# Patient Record
Sex: Female | Born: 1970
Health system: Southern US, Community
[De-identification: ages and names within clinical notes are randomized; demographics above are authoritative.]

## PROBLEM LIST (undated history)

## (undated) DIAGNOSIS — M069 Rheumatoid arthritis, unspecified: Secondary | ICD-10-CM

## (undated) DIAGNOSIS — F419 Anxiety disorder, unspecified: Secondary | ICD-10-CM

## (undated) HISTORY — DX: Anxiety disorder, unspecified: F41.9

## (undated) HISTORY — DX: Rheumatoid arthritis, unspecified: M06.9

## (undated) HISTORY — PX: BACK SURGERY: SHX140

## (undated) HISTORY — PX: AUGMENTATION MAMMAPLASTY: SUR837

## (undated) HISTORY — PX: ABDOMINOPLASTY: SUR9

---

## 2012-10-28 DIAGNOSIS — M545 Low back pain, unspecified: Secondary | ICD-10-CM | POA: Insufficient documentation

## 2016-05-07 DIAGNOSIS — M0579 Rheumatoid arthritis with rheumatoid factor of multiple sites without organ or systems involvement: Secondary | ICD-10-CM | POA: Insufficient documentation

## 2017-08-04 DIAGNOSIS — Z79899 Other long term (current) drug therapy: Secondary | ICD-10-CM | POA: Insufficient documentation

## 2018-10-05 DIAGNOSIS — M0579 Rheumatoid arthritis with rheumatoid factor of multiple sites without organ or systems involvement: Secondary | ICD-10-CM | POA: Diagnosis not present

## 2018-10-05 DIAGNOSIS — Z79899 Other long term (current) drug therapy: Secondary | ICD-10-CM | POA: Diagnosis not present

## 2019-05-04 DIAGNOSIS — Z79899 Other long term (current) drug therapy: Secondary | ICD-10-CM | POA: Diagnosis not present

## 2019-05-04 DIAGNOSIS — M0579 Rheumatoid arthritis with rheumatoid factor of multiple sites without organ or systems involvement: Secondary | ICD-10-CM | POA: Diagnosis not present

## 2019-05-04 DIAGNOSIS — R7989 Other specified abnormal findings of blood chemistry: Secondary | ICD-10-CM | POA: Diagnosis not present

## 2019-08-01 DIAGNOSIS — H5213 Myopia, bilateral: Secondary | ICD-10-CM | POA: Diagnosis not present

## 2019-08-01 DIAGNOSIS — Z79899 Other long term (current) drug therapy: Secondary | ICD-10-CM | POA: Diagnosis not present

## 2019-12-21 DIAGNOSIS — M0579 Rheumatoid arthritis with rheumatoid factor of multiple sites without organ or systems involvement: Secondary | ICD-10-CM | POA: Diagnosis not present

## 2019-12-21 DIAGNOSIS — R7989 Other specified abnormal findings of blood chemistry: Secondary | ICD-10-CM | POA: Diagnosis not present

## 2019-12-21 DIAGNOSIS — Z79899 Other long term (current) drug therapy: Secondary | ICD-10-CM | POA: Diagnosis not present

## 2020-02-24 DIAGNOSIS — T2661XA Corrosion of cornea and conjunctival sac, right eye, initial encounter: Secondary | ICD-10-CM | POA: Diagnosis not present

## 2020-05-10 ENCOUNTER — Ambulatory Visit: Payer: Self-pay | Admitting: Medical-Surgical

## 2020-05-17 ENCOUNTER — Ambulatory Visit: Payer: Self-pay | Admitting: Medical-Surgical

## 2020-06-14 ENCOUNTER — Encounter: Payer: Self-pay | Admitting: Medical-Surgical

## 2020-06-14 ENCOUNTER — Ambulatory Visit (INDEPENDENT_AMBULATORY_CARE_PROVIDER_SITE_OTHER): Payer: BC Managed Care – PPO | Admitting: Medical-Surgical

## 2020-06-14 VITALS — BP 145/88 | HR 77 | Temp 97.9°F | Ht 64.0 in | Wt 142.4 lb

## 2020-06-14 DIAGNOSIS — Z23 Encounter for immunization: Secondary | ICD-10-CM | POA: Diagnosis not present

## 2020-06-14 DIAGNOSIS — F419 Anxiety disorder, unspecified: Secondary | ICD-10-CM | POA: Diagnosis not present

## 2020-06-14 DIAGNOSIS — M0579 Rheumatoid arthritis with rheumatoid factor of multiple sites without organ or systems involvement: Secondary | ICD-10-CM

## 2020-06-14 DIAGNOSIS — Z7689 Persons encountering health services in other specified circumstances: Secondary | ICD-10-CM

## 2020-06-14 DIAGNOSIS — Z1211 Encounter for screening for malignant neoplasm of colon: Secondary | ICD-10-CM

## 2020-06-14 DIAGNOSIS — Z1231 Encounter for screening mammogram for malignant neoplasm of breast: Secondary | ICD-10-CM | POA: Diagnosis not present

## 2020-06-14 MED ORDER — ALPRAZOLAM 0.25 MG PO TABS: 0.1250 mg | ORAL_TABLET | Freq: Every day | ORAL | 0 refills | Status: DC | PRN

## 2020-06-14 MED ORDER — HYDROXYCHLOROQUINE SULFATE 200 MG PO TABS: 400.0000 mg | ORAL_TABLET | Freq: Every day | ORAL | 0 refills | Status: DC

## 2020-06-14 MED ORDER — SERTRALINE HCL 50 MG PO TABS: 50.0000 mg | ORAL_TABLET | Freq: Every day | ORAL | 1 refills | Status: DC

## 2020-06-14 NOTE — Progress Notes (Signed)
New Patient Office Visit  Subjective:  Patient ID: Suzanne Erickson, female    DOB: 04-Mar-1971  Age: 49 y.o. MRN: [id number]  CC:  Chief Complaint  Patient presents with  . Establish Care    HPI Suzanne Erickson presents to establish care.  Works in Agricultural engineer.  Has a boxer who tends to be an anxious dog and has to take Prozac.  RA-was previously diagnosed with RA that affects her bilateral knees and ankles.  She notes that she does not often have pain in her knees and ankles but she does experience significant stiffness that at times limits her overall mobility.  She takes Plaquenil 400 mg daily and uses ibuprofen 400 mg as needed for discomfort.  She would like a referral to rheumatology to get established with a new provider today as her previous provider has left the practice.  Anxiety-taking sertraline 50 mg daily and Xanax 0.25 mg 1/2-1 tablet daily as needed.  Reports she uses these very sparingly and often 15 tablets will last her nearly a full year.  Notes her daughter recently left for basic training and she has been quite anxious for her welfare since then.  Oral lesions-recently saw a dentist who reported she had some ulcers/lesions on the roof of her mouth.  She has not noticed any pain or discomfort from these lesions and is unable to feel them.  Overdue for colonoscopy and mammogram.  Past Medical History:  Diagnosis Date  . Anxiety   . Rheumatoid arthritis [facility])     Past Surgical History:  Procedure Laterality Date  . BACK SURGERY      Family History  Problem Relation Age of Onset  . Hypertension Mother   . Osteoarthritis Mother   . Hypertension Father   . Skin cancer Father   . Atrial fibrillation Father     Social History   Socioeconomic History  . Marital status: Married    Spouse name: Not on file  . Number of children: Not on file  . Years of education: Not on file  . Highest education level: Not on file  Occupational History  . Not on file   Tobacco Use  . Smoking status: Never Smoker  . Smokeless tobacco: Never Used  Vaping Use  . Vaping Use: Never used  Substance and Sexual Activity  . Alcohol use: Yes    Alcohol/week: 14.0 standard drinks    Types: 14 Standard drinks or equivalent per week    Comment: 2 cocktails/evening  . Drug use: Never  . Sexual activity: Yes    Partners: Male    Birth control/protection: Surgical    Comment: Ablation, vasectomy  Other Topics Concern  . Not on file  Social History Narrative  . Not on file   Social Determinants of Health   Financial Resource Strain:   . Difficulty of Paying Living Expenses: Not on file  Food Insecurity:   . Worried About Programme researcher, broadcasting/film/video in the Last Year: Not on file  . Ran Out of Food in the Last Year: Not on file  Transportation Needs:   . Lack of Transportation (Medical): Not on file  . Lack of Transportation (Non-Medical): Not on file  Physical Activity:   . Days of Exercise per Week: Not on file  . Minutes of Exercise per Session: Not on file  Stress:   . Feeling of Stress : Not on file  Social Connections:   . Frequency of Communication with Friends and Family: Not on file  .  Frequency of Social Gatherings with Friends and Family: Not on file  . Attends Religious Services: Not on file  . Active Member of Clubs or Organizations: Not on file  . Attends Banker Meetings: Not on file  . Marital Status: Not on file  Intimate Partner Violence:   . Fear of Current or Ex-Partner: Not on file  . Emotionally Abused: Not on file  . Physically Abused: Not on file  . Sexually Abused: Not on file    ROS Review of Systems  Constitutional: Negative for chills, fatigue, fever and unexpected weight change.  Respiratory: Negative for cough, chest tightness, shortness of breath and wheezing.   Cardiovascular: Negative for chest pain, palpitations and leg swelling.  Gastrointestinal: Negative for abdominal pain, diarrhea, nausea and  vomiting.  Musculoskeletal: Positive for arthralgias.  Neurological: Negative for dizziness, light-headedness and headaches.  Psychiatric/Behavioral: Negative for dysphoric mood, self-injury, sleep disturbance and suicidal ideas. The patient is nervous/anxious.     Objective:   Today's Vitals: BP (!) 145/88   Pulse 77   Temp 97.9 F (36.6 C) (Oral)   Ht 5\' 4"  (1.626 m)   Wt 142 lb 6.4 oz (64.6 kg)   SpO2 100%   BMI 24.44 kg/m   Physical Exam Vitals reviewed.  Constitutional:      General: She is not in acute distress.    Appearance: Normal appearance.  HENT:     Head: Normocephalic and atraumatic.     Mouth/Throat:     Mouth: Mucous membranes are moist.     Pharynx: Oropharynx is clear. No posterior oropharyngeal erythema.     Comments: No oral lesions visualized along the hard and soft palate. Cardiovascular:     Rate and Rhythm: Normal rate and regular rhythm.     Pulses: Normal pulses.     Heart sounds: Normal heart sounds. No murmur heard.  No friction rub. No gallop.   Pulmonary:     Effort: Pulmonary effort is normal. No respiratory distress.     Breath sounds: Normal breath sounds. No wheezing.  Skin:    General: Skin is warm and dry.  Neurological:     Mental Status: She is alert and oriented to person, place, and time.  Psychiatric:        Mood and Affect: Mood normal.        Behavior: Behavior normal.        Thought Content: Thought content normal.        Judgment: Judgment normal.     Assessment & Plan:   1. Encounter to establish care Reviewed available information and discussed health care concerns with patient.  She is due for some lab work to follow-up on her RA.  2. Rheumatoid arthritis involving multiple sites with positive rheumatoid factor (HCC) Referring to rheumatology per patient request.  I will go ahead and refill her Plaquenil until she is able to establish a new provider there. Checking CBC with diff, CMP, and CRP. - Ambulatory  referral to Rheumatology - COMPLETE METABOLIC PANEL WITH GFR - High sensitivity CRP - CBC with Differential  3. Anxiety Continue Sertraline 50mg  daily. Refills provided. Ok to continue using Xanax very sparingly as needed.   4. Encounter for screening mammogram for malignant neoplasm of breast Mammogram ordered. - MM DIGITAL SCREENING BILATERAL; Future  5. Screening for colon cancer Referring to GI for colonoscopy. - Ambulatory referral to Gastroenterology  6. Need for influenza vaccination Flu shot given in office today. - Flu Vaccine QUAD  36+ mos IM  7. Need for Tdap vaccination Tdap updated in office today. - Tdap vaccine greater than or equal to 7yo IM  Outpatient Encounter Medications as of 06/14/2020  Medication Sig  . ALPRAZolam (XANAX) 0.25 MG tablet Take 0.5-1 tablets (0.125-0.25 mg total) by mouth daily as needed.  . hydroxychloroquine (PLAQUENIL) 200 MG tablet Take 2 tablets (400 mg total) by mouth daily.  . Multiple Vitamin tablet Take 1 tablet by mouth daily.  . sertraline (ZOLOFT) 50 MG tablet Take 1 tablet (50 mg total) by mouth daily.  . [DISCONTINUED] ALPRAZolam (XANAX) 0.25 MG tablet Take 0.5-1 tablets by mouth 3 (three) times daily as needed.  . [DISCONTINUED] hydroxychloroquine (PLAQUENIL) 200 MG tablet Take 2 tablets by mouth daily.  . [DISCONTINUED] sertraline (ZOLOFT) 50 MG tablet Take 1 tablet by mouth daily.   No facility-administered encounter medications on file as of 06/14/2020.    Follow-up: Return in about 6 months (around 12/12/2020) for mood follow up.   Thayer Ohm, DNP, APRN, FNP-BC [facility]

## 2020-06-28 ENCOUNTER — Telehealth: Payer: Self-pay

## 2020-06-28 NOTE — Telephone Encounter (Signed)
-----   Message from Christen Butter, NP sent at 06/28/2020 12:33 PM EDT ----- Please contact patient to see how her blood pressures have been running at home.  She was elevated here at her office visit 2 weeks ago and was advised to check her blood pressures and see what they are running at home.

## 2020-06-28 NOTE — Telephone Encounter (Signed)
LVMTRC (1st attempt)    Please see Joy's message below  

## 2020-07-02 NOTE — Telephone Encounter (Signed)
Spoke to pt who states she really has not been checking her BP at home since her OV. She said that she would start checking her BP and get back in touch with [place] later on this week with her readings.

## 2020-07-16 ENCOUNTER — Telehealth: Payer: Self-pay | Admitting: Medical-Surgical

## 2020-07-16 ENCOUNTER — Telehealth: Payer: Self-pay

## 2020-07-16 NOTE — Telephone Encounter (Signed)
-----   Message from Christen Butter, NP sent at 07/16/2020  8:16 AM EDT ----- Regarding: Blood pressure checkins Can you please contact Steph and see how her blood pressures have been? It was running high at her appointment and she was going to keep an eye on it.  Thanks, Joy ----- Message ----- From: Christen Butter, NP Sent: 07/16/2020 To: Christen Butter, NP  Check in with BPs

## 2020-07-16 NOTE — Telephone Encounter (Signed)
LVMTRC (1st attempt)  Please see Joy's note below 

## 2020-07-16 NOTE — Telephone Encounter (Signed)
Erroneous encounter

## 2020-07-17 NOTE — Telephone Encounter (Signed)
LVMTRC (2nd attempt) Please see Joy's note below

## 2020-07-18 NOTE — Telephone Encounter (Signed)
Patient aware of recommendations.  

## 2020-07-18 NOTE — Telephone Encounter (Signed)
Although the highest is a little above goal, with these readings, there is no indication to start medications. Continue to monitor at home several times a week with a goal of 130/80 or less. If consistently above this goal, will need to return for further evaluation. Thanks, Ander Slade

## 2020-07-18 NOTE — Telephone Encounter (Signed)
Patient called to report her recent BP readings.  The highest one is 134/86 and the lowest one is 115/82.  She has had multiple readings that fall between these two.  She can be reached at [phone] with any further recommendations.

## 2020-10-19 ENCOUNTER — Other Ambulatory Visit: Payer: Self-pay

## 2020-10-19 MED ORDER — HYDROXYCHLOROQUINE SULFATE 200 MG PO TABS: 400.0000 mg | ORAL_TABLET | Freq: Every day | ORAL | 0 refills | Status: DC

## 2020-12-14 ENCOUNTER — Ambulatory Visit: Payer: BC Managed Care – PPO | Admitting: Medical-Surgical

## 2020-12-20 ENCOUNTER — Ambulatory Visit (INDEPENDENT_AMBULATORY_CARE_PROVIDER_SITE_OTHER): Payer: BC Managed Care – PPO

## 2020-12-20 ENCOUNTER — Other Ambulatory Visit: Payer: Self-pay

## 2020-12-20 DIAGNOSIS — Z1231 Encounter for screening mammogram for malignant neoplasm of breast: Secondary | ICD-10-CM | POA: Diagnosis not present

## 2020-12-21 ENCOUNTER — Ambulatory Visit (INDEPENDENT_AMBULATORY_CARE_PROVIDER_SITE_OTHER): Payer: BC Managed Care – PPO | Admitting: Medical-Surgical

## 2020-12-21 ENCOUNTER — Encounter: Payer: Self-pay | Admitting: Medical-Surgical

## 2020-12-21 VITALS — BP 117/73 | HR 67 | Temp 98.5°F | Ht 64.0 in | Wt 149.2 lb

## 2020-12-21 DIAGNOSIS — Z1211 Encounter for screening for malignant neoplasm of colon: Secondary | ICD-10-CM | POA: Diagnosis not present

## 2020-12-21 DIAGNOSIS — M0579 Rheumatoid arthritis with rheumatoid factor of multiple sites without organ or systems involvement: Secondary | ICD-10-CM | POA: Diagnosis not present

## 2020-12-21 DIAGNOSIS — Z23 Encounter for immunization: Secondary | ICD-10-CM | POA: Diagnosis not present

## 2020-12-21 DIAGNOSIS — F419 Anxiety disorder, unspecified: Secondary | ICD-10-CM | POA: Diagnosis not present

## 2020-12-21 MED ORDER — ALPRAZOLAM 0.25 MG PO TABS: 0.1250 mg | ORAL_TABLET | Freq: Every day | ORAL | 0 refills | Status: AC | PRN

## 2020-12-21 MED ORDER — SERTRALINE HCL 50 MG PO TABS: 50.0000 mg | ORAL_TABLET | Freq: Every day | ORAL | 1 refills | Status: DC

## 2020-12-21 MED ORDER — HYDROXYCHLOROQUINE SULFATE 200 MG PO TABS: 400.0000 mg | ORAL_TABLET | Freq: Every day | ORAL | 1 refills | Status: DC

## 2020-12-21 NOTE — Patient Instructions (Signed)
Recombinant Zoster (Shingles) Vaccine: What You Need to Know 1. Why get vaccinated? Recombinant zoster (shingles) vaccine can prevent shingles. Shingles (also called herpes zoster, or just zoster) is a painful skin rash, usually with blisters. In addition to the rash, shingles can cause fever, headache, chills, or upset stomach. More rarely, shingles can lead to pneumonia, hearing problems, blindness, brain inflammation (encephalitis), or death. The most common complication of shingles is long-term nerve pain called postherpetic neuralgia (PHN). PHN occurs in the areas where the shingles rash was, even after the rash clears up. It can last for months or years after the rash goes away. The pain from PHN can be severe and debilitating. About 10 to 18% of people who get shingles will experience PHN. The risk of PHN increases with age. An older adult with shingles is more likely to develop PHN and have longer lasting and more severe pain than a younger person with shingles. Shingles is caused by the varicella zoster virus, the same virus that causes chickenpox. After you have chickenpox, the virus stays in your body and can cause shingles later in life. Shingles cannot be passed from one person to another, but the virus that causes shingles can spread and cause chickenpox in someone who had never had chickenpox or received chickenpox vaccine. 2. Recombinant shingles vaccine Recombinant shingles vaccine provides strong protection against shingles. By preventing shingles, recombinant shingles vaccine also protects against PHN. Recombinant shingles vaccine is the preferred vaccine for the prevention of shingles. However, a different vaccine, live shingles vaccine, may be used in some circumstances. The recombinant shingles vaccine is recommended for adults 50 years and older without serious immune problems. It is given as a two-dose series. This vaccine is also recommended for people who have already gotten  another type of shingles vaccine, the live shingles vaccine. There is no live virus in this vaccine. Shingles vaccine may be given at the same time as other vaccines. 3. Talk with your health care provider Tell your vaccine provider if the person getting the vaccine:  Has had an allergic reaction after a previous dose of recombinant shingles vaccine, or has any severe, life-threatening allergies.  Is pregnant or breastfeeding.  Is currently experiencing an episode of shingles. In some cases, your health care provider may decide to postpone shingles vaccination to a future visit. People with minor illnesses, such as a cold, may be vaccinated. People who are moderately or severely ill should usually wait until they recover before getting recombinant shingles vaccine. Your health care provider can give you more information. 4. Risks of a vaccine reaction  A sore arm with mild or moderate pain is very common after recombinant shingles vaccine, affecting about 80% of vaccinated people. Redness and swelling can also happen at the site of the injection.  Tiredness, muscle pain, headache, shivering, fever, stomach pain, and nausea happen after vaccination in more than half of people who receive recombinant shingles vaccine. In clinical trials, about 1 out of 6 people who got recombinant zoster vaccine experienced side effects that prevented them from doing regular activities. Symptoms usually went away on their own in 2 to 3 days. You should still get the second dose of recombinant zoster vaccine even if you had one of these reactions after the first dose. People sometimes faint after medical procedures, including vaccination. Tell your provider if you feel dizzy or have vision changes or ringing in the ears. As with any medicine, there is a very remote chance of a vaccine causing   a severe allergic reaction, other serious injury, or death. 5. What if there is a serious problem? An allergic reaction  could occur after the vaccinated person leaves the clinic. If you see signs of a severe allergic reaction (hives, swelling of the face and throat, difficulty breathing, a fast heartbeat, dizziness, or weakness), call 9-1-1 and get the person to the nearest hospital. For other signs that concern you, call your health care provider. Adverse reactions should be reported to the Vaccine Adverse Event Reporting System (VAERS). Your health care provider will usually file this report, or you can do it yourself. Visit the VAERS website at www.vaers.hhs.gov or call 1-800-822-7967. VAERS is only for reporting reactions, and VAERS staff do not give medical advice. 6. How can I learn more?  Ask your health care provider.  Call your local or state health department.  Contact the Centers for Disease Control and Prevention (CDC): ? Call 1-800-232-4636 (1-800-CDC-INFO) or ? Visit CDC's website at www.cdc.gov/vaccines Vaccine Information Statement Recombinant Zoster Vaccine (07/28/2018) This information is not intended to replace advice given to you by your health care provider. Make sure you discuss any questions you have with your health care provider. Document Revised: 05/18/2020 Document Reviewed: 05/18/2020 Elsevier Patient Education  2021 Elsevier Inc.  

## 2020-12-21 NOTE — Progress Notes (Signed)
Subjective:    CC: Mood follow-up  HPI: Pleasant 50 year old female presenting today for follow-up of:  Mood-taking sertraline 50 mg daily, tolerating well without side effects.  Feels that her overall symptoms are very well controlled.  She does have occasional breakthroughs of anxiety but these are not severe.  Every now and then, she does have severe anxiety episodes and she has Xanax on hand to take but cannot remember the last time she had to take a dose.  Denies SI/HI.  RA-taking Plaquenil 4 mg daily, tolerating well without side effects.  Requesting refills today.  She does have a new patient appointment with rheumatology in April already scheduled.  She is overdue for getting a colonoscopy done and would like a referral to get that taken care of today.  She is also due for shingles vaccination and would like to get that started today.  I reviewed the past medical history, family history, social history, surgical history, and allergies today and no changes were needed.  Please see the problem list section below in epic for further details.  Past Medical History: Past Medical History:  Diagnosis Date  . Anxiety   . Rheumatoid arthritis [facility])    Past Surgical History: Past Surgical History:  Procedure Laterality Date  . AUGMENTATION MAMMAPLASTY     Saline, behind the muscle, augmentation was over 15 years ago.  Marland Kitchen BACK SURGERY     Social History: Social History   Socioeconomic History  . Marital status: Married    Spouse name: Not on file  . Number of children: Not on file  . Years of education: Not on file  . Highest education level: Not on file  Occupational History  . Not on file  Tobacco Use  . Smoking status: Never Smoker  . Smokeless tobacco: Never Used  Vaping Use  . Vaping Use: Never used  Substance and Sexual Activity  . Alcohol use: Yes    Alcohol/week: 14.0 standard drinks    Types: 14 Standard drinks or equivalent per week    Comment: 2  cocktails/evening  . Drug use: Never  . Sexual activity: Yes    Partners: Male    Birth control/protection: Surgical    Comment: Ablation, vasectomy  Other Topics Concern  . Not on file  Social History Narrative  . Not on file   Social Determinants of Health   Financial Resource Strain: Not on file  Food Insecurity: Not on file  Transportation Needs: Not on file  Physical Activity: Not on file  Stress: Not on file  Social Connections: Not on file   Family History: Family History  Problem Relation Age of Onset  . Hypertension Mother   . Osteoarthritis Mother   . Hypertension Father   . Skin cancer Father   . Atrial fibrillation Father    Allergies: Allergies  Allergen Reactions  . Amoxicillin Rash  . Sulfa Antibiotics Rash   Medications: See med rec.  Review of Systems: See HPI for pertinent positives and negatives.   Objective:    General: Well Developed, well nourished, and in no acute distress.  Neuro: Alert and oriented x3.  HEENT: Normocephalic, atraumatic.  Skin: Warm and dry. Cardiac: Regular rate and rhythm, no murmurs rubs or gallops, no lower extremity edema.  Respiratory: Clear to auscultation bilaterally. Not using accessory muscles, speaking in full sentences.  Impression and Recommendations:    1. Anxiety Continue sertraline 50 mg daily.  For severe anxiety, okay to use Xanax sparingly.  Sending in  15 tabs today as hers will likely be expiring soon.  2. Rheumatoid arthritis involving multiple sites with positive rheumatoid factor (HCC) Continue hydroxychloroquine 400 mg daily.  Refill provided.  Continue with plan to establish with rheumatology.  3. Need for shingles vaccine Shingles vaccine given in office. - Varicella-zoster vaccine IM  4. Screening for colon cancer Referring to GI for colonoscopy. - Ambulatory referral to Gastroenterology  Return in about 6 months (around 06/23/2021) for general follow up; at your convenience for pap  smear. ___________________________________________ Thayer Ohm, DNP, APRN, FNP-BC Primary Care and Sports Medicine [facility] [place]

## 2021-01-04 NOTE — Progress Notes (Addendum)
Office Visit Note  Patient: Suzanne Erickson             Date of Birth: August 05, 1971           MRN: [id number]             PCP: Christen ButterJessup, Joy, NP Referring: Christen ButterJessup, Joy, NP Visit Date: 01/18/2021 Occupation: @GUAROCC @  Subjective:  Pain and stiffness in joints   History of Present Illness: Suzanne Erickson is a 50 y.o. female with history of seropositive rheumatoid arthritis.  She has been seen in consultation per request of her PCP.  Patient states in 2015 she had a flu vaccine and after that she started having pain in her knee joints and ankle joints.  She states she was seen by her PCP who examined her and diagnosed her with rheumatoid arthritis.  Her rheumatoid factor was also positive.  She was referred to Dr. Ancil LinseyBravo who started her on Plaquenil.  She states she has been on Plaquenil since then.  She had only 1 flare a year ago for which she was given prednisone although she did not notice any improvement in her symptoms on prednisone.  She has not noticed any joint swelling but she continues to have some stiffness in her knees and her feet.  She also notices some discomfort in her upper arms.  She denies discomfort in her shoulders.  There is no history of oral ulcers, nasal ulcers, malar rash, photosensitivity, sicca symptoms, lymphadenopathy.  There is no family history of autoimmune disease.  She is gravida 4, para 2, miscarriages 2.  She is very active and exercise on a regular basis.  Activities of Daily Living:  Patient reports morning stiffness for 10-15 minutes.   Patient Denies nocturnal pain.  Difficulty dressing/grooming: Denies Difficulty climbing stairs: Reports Difficulty getting out of chair: Denies Difficulty using hands for taps, buttons, cutlery, and/or writing: Denies  Review of Systems  Constitutional: Positive for fatigue. Negative for night sweats, weight gain and weight loss.  HENT: Negative for mouth sores, trouble swallowing, trouble swallowing, mouth dryness and  nose dryness.   Eyes: Negative for pain, redness, itching, visual disturbance and dryness.  Respiratory: Negative for cough, shortness of breath and difficulty breathing.   Cardiovascular: Negative for chest pain, palpitations, hypertension, irregular heartbeat and swelling in legs/feet.  Gastrointestinal: Negative for blood in stool, constipation and diarrhea.  Endocrine: Negative for increased urination.  Genitourinary: Negative for difficulty urinating and vaginal dryness.  Musculoskeletal: Positive for morning stiffness. Negative for arthralgias, joint pain, joint swelling, myalgias, muscle weakness, muscle tenderness and myalgias.  Skin: Negative for color change, rash, hair loss, redness, skin tightness, ulcers and sensitivity to sunlight.  Allergic/Immunologic: Negative for susceptible to infections.  Neurological: Negative for dizziness, numbness, headaches, memory loss, night sweats and weakness.  Hematological: Negative for bruising/bleeding tendency and swollen glands.  Psychiatric/Behavioral: Negative for depressed mood, confusion and sleep disturbance. The patient is nervous/anxious.     PMFS History:  Patient Active Problem List   Diagnosis Date Noted   Long-term use of high-risk medication 08/04/2017   Rheumatoid arthritis involving multiple sites with positive rheumatoid factor (HCC) 05/07/2016   Low back pain 10/28/2012   Allergic rhinitis 10/23/2011   Anxiety 04/16/2011    Past Medical History:  Diagnosis Date   Anxiety    Rheumatoid arthritis (HCC)     Family History  Problem Relation Age of Onset   Hypertension Mother    Osteoarthritis Mother    Hypertension Father  Skin cancer Father    Atrial fibrillation Father    Healthy Sister    Healthy Daughter    Healthy Daughter    Past Surgical History:  Procedure Laterality Date   AUGMENTATION MAMMAPLASTY     Saline, behind the muscle, augmentation was over 15 years ago.   BACK SURGERY     Social History    Social History Narrative   Not on file   Immunization History  Administered Date(s) Administered   Influenza, Seasonal, Injecte, Preservative Fre 06/29/2013   Influenza,inj,Quad PF,6+ Mos 06/14/2020   PFIZER(Purple Top)SARS-COV-2 Vaccination 10/19/2019, 11/09/2019, 07/26/2020   Td 01/26/2004   Tdap 10/14/2007, 06/14/2020   Zoster Recombinat (Shingrix) 12/21/2020     Objective: Vital Signs: BP (!) 144/93 (BP Location: Right Arm, Patient Position: Sitting, Cuff Size: Normal)   Pulse 64   Resp 14   Ht 5' 3.75" (1.619 m)   Wt 150 lb (68 kg)   BMI 25.95 kg/m    Physical Exam Vitals and nursing note reviewed.  Constitutional:      Appearance: She is well-developed.  HENT:     Head: Normocephalic and atraumatic.  Eyes:     Conjunctiva/sclera: Conjunctivae normal.  Cardiovascular:     Rate and Rhythm: Normal rate and regular rhythm.     Heart sounds: Normal heart sounds.  Pulmonary:     Effort: Pulmonary effort is normal.     Breath sounds: Normal breath sounds.  Abdominal:     General: Bowel sounds are normal.     Palpations: Abdomen is soft.  Musculoskeletal:     Cervical back: Normal range of motion.  Lymphadenopathy:     Cervical: No cervical adenopathy.  Skin:    General: Skin is warm and dry.     Capillary Refill: Capillary refill takes less than 2 seconds.  Neurological:     Mental Status: She is alert and oriented to person, place, and time.  Psychiatric:        Behavior: Behavior normal.      Musculoskeletal Exam: C-spine was in good range of motion.  Shoulder joints, elbow joints, wrist joints, MCPs PIPs and DIPs with good range of motion.  She has some hypermobility in her elbows and her MCPs.  She has bilateral PIP and DIP thickening with no synovitis.  Hip joints, knee joints, ankles, MTPs and PIPs with good range of motion.  She has bilateral first MTP, PIP and DIP thickening with mild synovitis.  CDAI Exam: CDAI Score: 0.6  Patient Global: 3 mm;  Provider Global: 3 mm Swollen: 0 ; Tender: 0  Joint Exam 01/18/2021   No joint exam has been documented for this visit   There is currently no information documented on the homunculus. Go to the Rheumatology activity and complete the homunculus joint exam.  Investigation: No additional findings.  Imaging: MM 3D SCREEN BREAST W/IMPLANT BILATERAL  Result Date: 12/25/2020 CLINICAL DATA:  Screening. EXAM: DIGITAL SCREENING BILATERAL MAMMOGRAM WITH IMPLANTS, CAD AND TOMOSYNTHESIS TECHNIQUE: Bilateral screening digital craniocaudal and mediolateral oblique mammograms were obtained. Bilateral screening digital breast tomosynthesis was performed. The images were evaluated with computer-aided detection. Standard and/or implant displaced views were performed. COMPARISON:  Previous exam(s). ACR Breast Density Category b: There are scattered areas of fibroglandular density. FINDINGS: The patient has retropectoral implants. There are no findings suspicious for malignancy. IMPRESSION: No mammographic evidence of malignancy. A result letter of this screening mammogram will be mailed directly to the patient. RECOMMENDATION: Screening mammogram in one year. (Code:SM-B-01Y) BI-RADS  CATEGORY  1:  Negative. Electronically Signed   By: Norva Pavlov M.D.   On: 12/25/2020 11:39    Recent Labs: No results found for: WBC, HGB, PLT, NA, K, CL, CO2, GLUCOSE, BUN, CREATININE, BILITOT, ALKPHOS, AST, ALT, PROT, ALBUMIN, CALCIUM, GFRAA, QFTBGOLD, QFTBGOLDPLUS  Speciality Comments: No specialty comments available.  Procedures:  No procedures performed Allergies: Amoxicillin and Sulfa antibiotics   Assessment / Plan:     Visit Diagnoses: Rheumatoid arthritis involving multiple sites with positive rheumatoid factor (HCC) -patient was diagnosed with rheumatoid arthritis in 2015.  She has been under care of Dr. Ancil Linsey for many years.  She denies any joint swelling but continues to have some stiffness.  I do not see any  synovitis on examination today.  Plan: Sedimentation rate, Rheumatoid factor, Cyclic citrul peptide antibody, IgG, ANA  Patient was counseled on the purpose, proper use, and adverse effects of hydroxychloroquine including nausea/diarrhea, skin rash, headaches, and sun sensitivity.  Discussed importance of annual eye exams while on hydroxychloroquine to monitor to ocular toxicity and discussed importance of frequent laboratory monitoring.  Provided patient with eye exam form for baseline ophthalmologic exam.  Provided patient with educational materials on hydroxychloroquine and answered all questions.  Patient consented to hydroxychloroquine.  Will upload consent in the media tab.    Long-term use of high-risk medication -she is on Plaquenil 200 mg p.o. twice daily.  Based on her height the dose of Plaquenil should be 200 mg p.o. twice daily Monday to Friday.  Reduced dose was discussed.  She has not had an eye examination in 2 years.  She has been advised to get an eye examination as soon as possible.  Increased risk of ocular toxicity was discussed.  Plan: CBC with Differential/Platelet, COMPLETE METABOLIC PANEL WITH GFR today and then every 5 months.  She is fully vaccinated against COVID-19.  Recommendations per ACR regarding a fourth dose was discussed.  Also pneumococcal and Shingrix vaccine was advised.  Recommendations were placed in the AVS.  Pain in both hands -she complains of pain and discomfort in the bilateral hands.  No synovitis was noted.  DIP and PIP thickening was noted.  Clinical findings are consistent with osteoarthritis.  Plan: XR Hand 2 View Right, XR Hand 2 View Left.  X-ray of bilateral hands were unremarkable.  Primary osteoarthritis of both hands  Chronic pain of both knees -she complains of discomfort in the bilateral knee joints and stiffness.  No warmth swelling effusion was noted.  Plan: XR KNEE 3 VIEW RIGHT, XR KNEE 3 VIEW LEFT.  Right knee joint showed moderate  osteoarthritis and mild chondromalacia patella.  Left knee joint showed severe osteoarthritis and mild chondromalacia patella.  Pain in both feet -she complains of of stiffness in her feet.  No synovitis was noted.  Clinical findings are consistent with osteoarthritis.  Plan: XR Foot 2 Views Right, XR Foot 2 Views Left.  X-ray of bilateral feet were consistent with osteoarthritis.  Primary osteoarthritis of both feet  Other fatigue -she has been experiencing increased fatigue.  I will obtain following labs.  Plan: CK, TSH, VITAMIN D 25 Hydroxy (Vit-D Deficiency, Fractures)  Elevated blood pressure reading-blood pressure in the office today was 144/93.  I have advised her to monitor her blood pressure closely.  If it stays elevated she may schedule an appointment with her PCP for the treatment.  Increased risk of heart disease with rheumatoid arthritis was discussed.  A handout was given regarding dietary modifications and exercise.  She is active and does exercise on a regular basis.  History of anxiety-she is on Zoloft.  Osteoporosis screening-she is postmenopausal.  We will schedule DEXA scan to evaluate bone density.  Postmenopausal  Orders: Orders Placed This Encounter  Procedures   XR Hand 2 View Right   XR Hand 2 View Left   XR KNEE 3 VIEW RIGHT   XR KNEE 3 VIEW LEFT   XR Foot 2 Views Right   XR Foot 2 Views Left   DG BONE DENSITY (DXA)   CBC with Differential/Platelet   COMPLETE METABOLIC PANEL WITH GFR   Sedimentation rate   CK   Rheumatoid factor   Cyclic citrul peptide antibody, IgG   ANA   TSH   VITAMIN D 25 Hydroxy (Vit-D Deficiency, Fractures)   Meds ordered this encounter  Medications   hydroxychloroquine (PLAQUENIL) 200 MG tablet    Sig: Take 200mg  by mouth twice daily, Monday through Friday only. None on Saturday or Sunday.    Dispense:  120 tablet    Refill:  0      Follow-Up Instructions: Return in about 5 months (around 06/20/2021) for Rheumatoid  arthritis.   06/22/2021, MD  Note - This record has been created using Pollyann Savoy.  Chart creation errors have been sought, but may not always  have been located. Such creation errors do not reflect on  the standard of medical care.

## 2021-01-11 ENCOUNTER — Ambulatory Visit: Payer: BC Managed Care – PPO | Admitting: Rheumatology

## 2021-01-18 ENCOUNTER — Ambulatory Visit: Payer: Self-pay

## 2021-01-18 ENCOUNTER — Encounter: Payer: Self-pay | Admitting: Rheumatology

## 2021-01-18 ENCOUNTER — Ambulatory Visit (INDEPENDENT_AMBULATORY_CARE_PROVIDER_SITE_OTHER): Payer: BC Managed Care – PPO | Admitting: Rheumatology

## 2021-01-18 ENCOUNTER — Other Ambulatory Visit: Payer: Self-pay

## 2021-01-18 VITALS — BP 144/93 | HR 64 | Resp 14 | Ht 63.75 in | Wt 150.0 lb

## 2021-01-18 DIAGNOSIS — M0579 Rheumatoid arthritis with rheumatoid factor of multiple sites without organ or systems involvement: Secondary | ICD-10-CM | POA: Diagnosis not present

## 2021-01-18 DIAGNOSIS — M19071 Primary osteoarthritis, right ankle and foot: Secondary | ICD-10-CM

## 2021-01-18 DIAGNOSIS — M25562 Pain in left knee: Secondary | ICD-10-CM | POA: Diagnosis not present

## 2021-01-18 DIAGNOSIS — Z8659 Personal history of other mental and behavioral disorders: Secondary | ICD-10-CM

## 2021-01-18 DIAGNOSIS — M79642 Pain in left hand: Secondary | ICD-10-CM | POA: Diagnosis not present

## 2021-01-18 DIAGNOSIS — Z79899 Other long term (current) drug therapy: Secondary | ICD-10-CM | POA: Diagnosis not present

## 2021-01-18 DIAGNOSIS — M25561 Pain in right knee: Secondary | ICD-10-CM

## 2021-01-18 DIAGNOSIS — G8929 Other chronic pain: Secondary | ICD-10-CM | POA: Diagnosis not present

## 2021-01-18 DIAGNOSIS — M79641 Pain in right hand: Secondary | ICD-10-CM | POA: Diagnosis not present

## 2021-01-18 DIAGNOSIS — Z780 Asymptomatic menopausal state: Secondary | ICD-10-CM

## 2021-01-18 DIAGNOSIS — R5383 Other fatigue: Secondary | ICD-10-CM | POA: Diagnosis not present

## 2021-01-18 DIAGNOSIS — M19041 Primary osteoarthritis, right hand: Secondary | ICD-10-CM

## 2021-01-18 DIAGNOSIS — M79671 Pain in right foot: Secondary | ICD-10-CM

## 2021-01-18 DIAGNOSIS — M79672 Pain in left foot: Secondary | ICD-10-CM

## 2021-01-18 DIAGNOSIS — M19042 Primary osteoarthritis, left hand: Secondary | ICD-10-CM

## 2021-01-18 DIAGNOSIS — Z13820 Encounter for screening for osteoporosis: Secondary | ICD-10-CM

## 2021-01-18 DIAGNOSIS — M19072 Primary osteoarthritis, left ankle and foot: Secondary | ICD-10-CM

## 2021-01-18 DIAGNOSIS — R030 Elevated blood-pressure reading, without diagnosis of hypertension: Secondary | ICD-10-CM

## 2021-01-18 MED ORDER — HYDROXYCHLOROQUINE SULFATE 200 MG PO TABS: ORAL_TABLET | ORAL | 0 refills | Status: DC

## 2021-01-18 NOTE — Patient Instructions (Addendum)
Plaquenil 200mg  by mouth twice daily, Monday through Friday only.     Hydroxychloroquine tablets What is this medicine? HYDROXYCHLOROQUINE (hye drox ee KLOR oh kwin) is used to treat rheumatoid arthritis and systemic lupus erythematosus. It is also used to treat malaria. This medicine may be used for other purposes; ask your health care provider or pharmacist if you have questions. COMMON BRAND NAME(S): Plaquenil, Quineprox What should I tell my health care provider before I take this medicine? They need to know if you have any of these conditions:  diabetes  eye disease, vision problems  G6PD deficiency  heart disease  history of irregular heartbeat  if you often drink alcohol  kidney disease  liver disease  porphyria  psoriasis  an unusual or allergic reaction to chloroquine, hydroxychloroquine, other medicines, foods, dyes, or preservatives  pregnant or trying to get pregnant  breast-feeding How should I use this medicine? Take this medicine by mouth with a glass of water. Take it as directed on the prescription label. Do not cut, crush or chew this medicine. Swallow the tablets whole. Take it with food. Do not take it more than directed. Take all of this medicine unless your health care provider tells you to stop it early. Keep taking it even if you think you are better. Take products with antacids in them at a different time of day than this medicine. Take this medicine 4 hours before or 4 hours after antacids. Talk to your health care provider if you have questions. Talk to your pediatrician regarding the use of this medicine in children. While this drug may be prescribed for selected conditions, precautions do apply. Overdosage: If you think you have taken too much of this medicine contact a poison control center or emergency room at once. NOTE: This medicine is only for you. Do not share this medicine with others. What if I miss a dose? If you miss a dose, take it  as soon as you can. If it is almost time for your next dose, take only that dose. Do not take double or extra doses. What may interact with this medicine? Do not take this medicine with any of the following medications:  cisapride  dronedarone  pimozide  thioridazine This medicine may also interact with the following medications:  ampicillin  antacids  cimetidine  cyclosporine  digoxin  kaolin  medicines for diabetes, like insulin, glipizide, glyburide  medicines for seizures like carbamazepine, phenobarbital, phenytoin  mefloquine  methotrexate  other medicines that prolong the QT interval (cause an abnormal heart rhythm)  praziquantel This list may not describe all possible interactions. Give your health care provider a list of all the medicines, herbs, non-prescription drugs, or dietary supplements you use. Also tell them if you smoke, drink alcohol, or use illegal drugs. Some items may interact with your medicine. What should I watch for while using this medicine? Visit your health care provider for regular checks on your progress. Tell your health care provider if your symptoms do not start to get better or if they get worse. You may need blood work done while you are taking this medicine. If you take other medicines that can affect heart rhythm, you may need more testing. Talk to your health care provider if you have questions. Your vision may be tested before and during use of this medicine. Tell your health care provider right away if you have any change in your eyesight. This medicine may cause serious skin reactions. They can happen weeks to  months after starting the medicine. Contact your health care provider right away if you notice fevers or flu-like symptoms with a rash. The rash may be red or purple and then turn into blisters or peeling of the skin. Or, you might notice a red rash with swelling of the face, lips or lymph nodes in your neck or under your  arms. If you or your family notice any changes in your behavior, such as new or worsening depression, thoughts of harming yourself, anxiety, or other unusual or disturbing thoughts, or memory loss, call your health care provider right away. What side effects may I notice from receiving this medicine? Side effects that you should report to your doctor or health care professional as soon as possible:  allergic reactions (skin rash, itching or hives; swelling of the face, lips, or tongue)  changes in vision  decreased hearing, ringing in the ears  heartbeat rhythm changes (trouble breathing; chest pain; dizziness; fast, irregular heartbeat; feeling faint or lightheaded, falls)  liver injury (dark yellow or brown urine; general ill feeling or flu-like symptoms; loss of appetite, right upper belly pain; unusually weak or tired, yellowing of the eyes or skin)  low blood sugar (feeling anxious; confusion; dizziness; increased hunger; unusually weak or tired; increased sweating; shakiness; cold, clammy skin; irritable; headache; blurred vision; fast heartbeat; loss of consciousness)  low red blood cell counts (trouble breathing; feeling faint; lightheaded, falls; unusually weak or tired)  muscle weakness  pain, tingling, numbness in the hands or feet  rash, fever, and swollen lymph nodes  redness, blistering, peeling or loosening of the skin, including inside the mouth  suicidal thoughts, mood changes  uncontrollable head, mouth, neck, arm, or leg movements  unusual bruising or bleeding Side effects that usually do not require medical attention (report to your doctor or health care professional if they continue or are bothersome):  diarrhea  hair loss  irritable This list may not describe all possible side effects. Call your doctor for medical advice about side effects. You may report side effects to FDA at 1-800-FDA-1088. Where should I keep my medicine? Keep out of the reach of  children and pets. Store at room temperature up to 30 degrees C (86 degrees F). Protect from light. Get rid of any unused medicine after the expiration date. To get rid of medicines that are no longer needed or have expired:  Take the medicine to a medicine take-back program. Check with your pharmacy or law enforcement to find a location.  If you cannot return the medicine, check the label or package insert to see if the medicine should be thrown out in the garbage or flushed down the toilet. If you are not sure, ask your health care provider. If it is safe to put it in the trash, empty the medicine out of the container. Mix the medicine with cat litter, dirt, coffee grounds, or other unwanted substance. Seal the mixture in a bag or container. Put it in the trash. NOTE: This sheet is a summary. It may not cover all possible information. If you have questions about this medicine, talk to your doctor, pharmacist, or health care provider.  2021 Elsevier/Gold Standard (2020-03-05 15:07:49)  Vaccines You are taking a medication(s) that can suppress your immune system.  The following immunizations are recommended: . Flu annually . Covid-19  . Pneumonia (Pneumovax 23 and Prevnar 13 spaced at least 1 year apart) . Shingrix (after age 15)  Please check with your PCP to make sure you  are up to date.   Heart Disease Prevention   Your inflammatory disease increases your risk of heart disease which includes heart attack, stroke, atrial fibrillation (irregular heartbeats), high blood pressure, heart failure and atherosclerosis (plaque in the arteries).  It is important to reduce your risk by:   . Keep blood pressure, cholesterol, and blood sugar at healthy levels   . Smoking Cessation   . Maintain a healthy weight  o BMI 20-25   . Eat a healthy diet  o Plenty of fresh fruit, vegetables, and whole grains  o Limit saturated fats, foods high in sodium, and added sugars  o DASH and Mediterranean  diet   . Increase physical activity  o Recommend moderate physically activity for 150 minutes per week/ 30 minutes a day for five days a week These can be broken up into three separate ten-minute sessions during the day.   . Reduce Stress  . Meditation, slow breathing exercises, yoga, coloring books  . Dental visits twice a year

## 2021-01-21 LAB — CBC WITH DIFFERENTIAL/PLATELET
Absolute Monocytes: 437 cells/uL (ref 200–950)
Basophils Absolute: 59 cells/uL (ref 0–200)
Basophils Relative: 1.5 %
Eosinophils Absolute: 90 cells/uL (ref 15–500)
Eosinophils Relative: 2.3 %
HCT: 42.5 % (ref 35.0–45.0)
Hemoglobin: 14.6 g/dL (ref 11.7–15.5)
Lymphs Abs: 1486 cells/uL (ref 850–3900)
MCH: 32.4 pg (ref 27.0–33.0)
MCHC: 34.4 g/dL (ref 32.0–36.0)
MCV: 94.4 fL (ref 80.0–100.0)
MPV: 10.5 fL (ref 7.5–12.5)
Monocytes Relative: 11.2 %
Neutro Abs: 1829 cells/uL (ref 1500–7800)
Neutrophils Relative %: 46.9 %
Platelets: 190 10*3/uL (ref 140–400)
RBC: 4.5 10*6/uL (ref 3.80–5.10)
RDW: 12.2 % (ref 11.0–15.0)
Total Lymphocyte: 38.1 %
WBC: 3.9 10*3/uL (ref 3.8–10.8)

## 2021-01-21 LAB — CK: Total CK: 156 U/L — ABNORMAL HIGH (ref 29–143)

## 2021-01-21 LAB — VITAMIN D 25 HYDROXY (VIT D DEFICIENCY, FRACTURES): Vit D, 25-Hydroxy: 39 ng/mL (ref 30–100)

## 2021-01-21 LAB — COMPLETE METABOLIC PANEL WITH GFR
AG Ratio: 1.8 (calc) (ref 1.0–2.5)
ALT: 34 U/L — ABNORMAL HIGH (ref 6–29)
AST: 35 U/L (ref 10–35)
Albumin: 4.7 g/dL (ref 3.6–5.1)
Alkaline phosphatase (APISO): 76 U/L (ref 37–153)
BUN: 9 mg/dL (ref 7–25)
CO2: 27 mmol/L (ref 20–32)
Calcium: 9.9 mg/dL (ref 8.6–10.4)
Chloride: 102 mmol/L (ref 98–110)
Creat: 0.6 mg/dL (ref 0.50–1.05)
GFR, Est African American: 121 mL/min/{1.73_m2} (ref >=60)
GFR, Est Non African American: 105 mL/min/{1.73_m2} (ref >=60)
Globulin: 2.6 g/dL (calc) (ref 1.9–3.7)
Glucose, Bld: 82 mg/dL (ref 65–99)
Potassium: 4.1 mmol/L (ref 3.5–5.3)
Sodium: 140 mmol/L (ref 135–146)
Total Bilirubin: 0.6 mg/dL (ref 0.2–1.2)
Total Protein: 7.3 g/dL (ref 6.1–8.1)

## 2021-01-21 LAB — SEDIMENTATION RATE: Sed Rate: 6 mm/h (ref 0–30)

## 2021-01-21 LAB — RHEUMATOID FACTOR: Rheumatoid fact SerPl-aCnc: 17 IU/mL — ABNORMAL HIGH (ref <14)

## 2021-01-21 LAB — TSH: TSH: 2.24 mIU/L

## 2021-01-21 LAB — ANA: Anti Nuclear Antibody (ANA): NEGATIVE

## 2021-01-21 LAB — CYCLIC CITRUL PEPTIDE ANTIBODY, IGG: Cyclic Citrullin Peptide Ab: <16 UNITS

## 2021-01-22 NOTE — Progress Notes (Signed)
CBC is normal, LFTs mildly elevated.  Rheumatoid factor is 17, anti-CCP is negative, ANA is negative, vitamin D is 39, ANA is negative and TSH is normal.  I will discuss results at the follow-up visit.

## 2021-01-31 ENCOUNTER — Ambulatory Visit: Payer: BC Managed Care – PPO | Admitting: Rheumatology

## 2021-02-01 DIAGNOSIS — Z20822 Contact with and (suspected) exposure to covid-19: Secondary | ICD-10-CM | POA: Diagnosis not present

## 2021-02-14 ENCOUNTER — Ambulatory Visit: Payer: BC Managed Care – PPO | Admitting: Rheumatology

## 2021-04-26 DIAGNOSIS — Z79899 Other long term (current) drug therapy: Secondary | ICD-10-CM | POA: Diagnosis not present

## 2021-04-26 DIAGNOSIS — H52203 Unspecified astigmatism, bilateral: Secondary | ICD-10-CM | POA: Diagnosis not present

## 2021-06-10 DIAGNOSIS — M19071 Primary osteoarthritis, right ankle and foot: Secondary | ICD-10-CM | POA: Insufficient documentation

## 2021-06-10 DIAGNOSIS — M170 Bilateral primary osteoarthritis of knee: Secondary | ICD-10-CM | POA: Insufficient documentation

## 2021-06-10 DIAGNOSIS — M19041 Primary osteoarthritis, right hand: Secondary | ICD-10-CM | POA: Insufficient documentation

## 2021-06-10 DIAGNOSIS — M19072 Primary osteoarthritis, left ankle and foot: Secondary | ICD-10-CM | POA: Insufficient documentation

## 2021-06-10 NOTE — Progress Notes (Deleted)
Office Visit Note  Patient: Suzanne Erickson             Date of Birth: June 10, 1971           MRN: [id number]             PCP: Christen Butter, NP Referring: Christen Butter, NP Visit Date: 06/19/2021 Occupation: @GUAROCC @  Subjective:  No chief complaint on file.   History of Present Illness: Suzanne Erickson is a 50 y.o. female ***   Activities of Daily Living:  Patient reports morning stiffness for *** {minute/hour:19697}.   Patient {ACTIONS;DENIES/REPORTS:21021675::"Denies"} nocturnal pain.  Difficulty dressing/grooming: {ACTIONS;DENIES/REPORTS:21021675::"Denies"} Difficulty climbing stairs: {ACTIONS;DENIES/REPORTS:21021675::"Denies"} Difficulty getting out of chair: {ACTIONS;DENIES/REPORTS:21021675::"Denies"} Difficulty using hands for taps, buttons, cutlery, and/or writing: {ACTIONS;DENIES/REPORTS:21021675::"Denies"}  No Rheumatology ROS completed.   PMFS History:  Patient Active Problem List   Diagnosis Date Noted   Primary osteoarthritis of both hands 06/10/2021   Primary osteoarthritis of both knees 06/10/2021   Primary osteoarthritis of both feet 06/10/2021   Long-term use of high-risk medication 08/04/2017   Rheumatoid arthritis involving multiple sites with positive rheumatoid factor (HCC) 05/07/2016   Low back pain 10/28/2012   Allergic rhinitis 10/23/2011   Anxiety 04/16/2011    Past Medical History:  Diagnosis Date   Anxiety    Rheumatoid arthritis (HCC)     Family History  Problem Relation Age of Onset   Hypertension Mother    Osteoarthritis Mother    Hypertension Father    Skin cancer Father    Atrial fibrillation Father    Healthy Sister    Healthy Daughter    Healthy Daughter    Past Surgical History:  Procedure Laterality Date   AUGMENTATION MAMMAPLASTY     Saline, behind the muscle, augmentation was over 15 years ago.   BACK SURGERY     Social History   Social History Narrative   Not on file   Immunization History  Administered Date(s)  Administered   Influenza, Seasonal, Injecte, Preservative Fre 06/29/2013   Influenza,inj,Quad PF,6+ Mos 06/14/2020   PFIZER(Purple Top)SARS-COV-2 Vaccination 10/19/2019, 11/09/2019, 07/26/2020   Td 01/26/2004   Tdap 10/14/2007, 06/14/2020   Zoster Recombinat (Shingrix) 12/21/2020     Objective: Vital Signs: There were no vitals taken for this visit.   Physical Exam   Musculoskeletal Exam: ***  CDAI Exam: CDAI Score: -- Patient Global: --; Provider Global: -- Swollen: --; Tender: -- Joint Exam 06/19/2021   No joint exam has been documented for this visit   There is currently no information documented on the homunculus. Go to the Rheumatology activity and complete the homunculus joint exam.  Investigation: No additional findings.  Imaging: XR Foot 2 Views Left  Result Date: 06/10/2021 First MTP, PIP and DIP narrowing was noted.  Intertarsal, tibiotalar or subtalar joint space narrowing was noted.  No erosive changes were noted. Impression: These findings are consistent with osteoarthritis of the foot.  XR Foot 2 Views Right  Result Date: 06/10/2021 Severe first MTP narrowing, PIP and DIP narrowing was noted.  No intertarsal, tibiotalar or subtalar joint space narrowing was noted.  No erosive changes were noted. Impression: These findings are consistent with osteoarthritis of the foot.  XR Hand 2 View Left  Result Date: 06/10/2021 No MCP, PIP or DIP narrowing was noted.  No intercarpal or radiocarpal joint space narrowing was noted.  No erosive changes were noted. Impression: Unremarkable x-ray of the hand.  XR Hand 2 View Right  Result Date: 06/10/2021 No MCP, PIP or DIP narrowing  was noted.  No intercarpal or radiocarpal joint space narrowing was noted.  No erosive changes were noted. Impression: Unremarkable x-ray of the hand.  XR KNEE 3 VIEW LEFT  Result Date: 06/10/2021 Severe medial compartment narrowing was noted.  Mild patellofemoral narrowing was noted.  No  chondrocalcinosis was noted. Impression: These findings are consistent with severe medial compartment narrowing and mild chondromalacia patella.  XR KNEE 3 VIEW RIGHT  Result Date: 06/10/2021 Mild to moderate medial compartment narrowing was noted.  Mild patellofemoral narrowing was noted.  No chondrocalcinosis was noted. Impression: These findings are consistent with mild to moderate osteoarthritis and mild chondromalacia patella.   Recent Labs: Lab Results  Component Value Date   WBC 3.9 01/18/2021   HGB 14.6 01/18/2021   PLT 190 01/18/2021   NA 140 01/18/2021   K 4.1 01/18/2021   CL 102 01/18/2021   CO2 27 01/18/2021   GLUCOSE 82 01/18/2021   BUN 9 01/18/2021   CREATININE 0.60 01/18/2021   BILITOT 0.6 01/18/2021   AST 35 01/18/2021   ALT 34 (H) 01/18/2021   PROT 7.3 01/18/2021   CALCIUM 9.9 01/18/2021   GFRAA 121 01/18/2021    January 18, 2021 CK156, TSH normal, sed rate 6, RF 17, anti-CCP negative, ANA negative, vitamin D 39  Speciality Comments: No specialty comments available.  Procedures:  No procedures performed Allergies: Amoxicillin and Sulfa antibiotics   Assessment / Plan:     Visit Diagnoses: Rheumatoid arthritis involving multiple sites with positive rheumatoid factor (HCC) - dxd 2015 by Dr. Ancil Linsey and treated with hydroxychloroquine.  Long-term use of high-risk medication - Hydroxychloroquine 200 mg p.o. twice daily Monday to Friday.  Dose was reduced at the last visit on January 18, 2021.  Primary osteoarthritis of both hands - Clinical and radiographic findings are consistent with osteoarthritis.  Primary osteoarthritis of both knees - X-ray of the right knee joint showed moderate osteoarthritis and mild chondromalacia patella.  Left knee joint showed severe osteoarthritis and mild chondromala  Primary osteoarthritis of both feet - Clinical and radiographic findings were consistent with osteoarthritis.  Elevated blood pressure reading  History of  anxiety  Osteoporosis screening - DEXA was ordered at the last visit.  Postmenopausal  Orders: No orders of the defined types were placed in this encounter.  No orders of the defined types were placed in this encounter.   Face-to-face time spent with patient was *** minutes. Greater than 50% of time was spent in counseling and coordination of care.  Follow-Up Instructions: No follow-ups on file.   Pollyann Savoy, MD  Note - This record has been created using [employer].  Chart creation errors have been sought, but may not always  have been located. Such creation errors do not reflect on  the standard of medical care.

## 2021-06-19 ENCOUNTER — Ambulatory Visit: Payer: BC Managed Care – PPO | Admitting: Rheumatology

## 2021-06-19 DIAGNOSIS — Z79899 Other long term (current) drug therapy: Secondary | ICD-10-CM

## 2021-06-19 DIAGNOSIS — R030 Elevated blood-pressure reading, without diagnosis of hypertension: Secondary | ICD-10-CM

## 2021-06-19 DIAGNOSIS — Z8659 Personal history of other mental and behavioral disorders: Secondary | ICD-10-CM

## 2021-06-19 DIAGNOSIS — M0579 Rheumatoid arthritis with rheumatoid factor of multiple sites without organ or systems involvement: Secondary | ICD-10-CM

## 2021-06-19 DIAGNOSIS — Z13820 Encounter for screening for osteoporosis: Secondary | ICD-10-CM

## 2021-06-19 DIAGNOSIS — M19071 Primary osteoarthritis, right ankle and foot: Secondary | ICD-10-CM

## 2021-06-19 DIAGNOSIS — Z780 Asymptomatic menopausal state: Secondary | ICD-10-CM

## 2021-06-19 DIAGNOSIS — M19041 Primary osteoarthritis, right hand: Secondary | ICD-10-CM

## 2021-06-19 DIAGNOSIS — M170 Bilateral primary osteoarthritis of knee: Secondary | ICD-10-CM

## 2021-06-24 ENCOUNTER — Ambulatory Visit: Payer: BC Managed Care – PPO | Admitting: Medical-Surgical

## 2021-08-15 ENCOUNTER — Other Ambulatory Visit: Payer: Self-pay | Admitting: Medical-Surgical

## 2021-09-12 ENCOUNTER — Other Ambulatory Visit: Payer: Self-pay | Admitting: Medical-Surgical

## 2021-10-21 ENCOUNTER — Other Ambulatory Visit: Payer: Self-pay | Admitting: Medical-Surgical

## 2021-10-24 DIAGNOSIS — M1990 Unspecified osteoarthritis, unspecified site: Secondary | ICD-10-CM | POA: Diagnosis not present

## 2021-10-24 DIAGNOSIS — Z0000 Encounter for general adult medical examination without abnormal findings: Secondary | ICD-10-CM | POA: Diagnosis not present

## 2021-10-24 DIAGNOSIS — Z13220 Encounter for screening for lipoid disorders: Secondary | ICD-10-CM | POA: Diagnosis not present

## 2021-10-24 DIAGNOSIS — F419 Anxiety disorder, unspecified: Secondary | ICD-10-CM | POA: Diagnosis not present

## 2021-10-24 DIAGNOSIS — Z23 Encounter for immunization: Secondary | ICD-10-CM | POA: Diagnosis not present

## 2021-11-19 ENCOUNTER — Other Ambulatory Visit: Payer: Self-pay | Admitting: Medical-Surgical

## 2021-12-13 NOTE — Progress Notes (Deleted)
? ?Office Visit Note ? ?Patient: Suzanne Erickson             ?Date of Birth: [birth year]           ?MRN: [id number]             ?PCP: Christen Butter, NP ?Referring: Christen Butter, NP ?Visit Date: 12/27/2021 ?Occupation: @GUAROCC @ ? ?Subjective:  ?No chief complaint on file. ? ? ?History of Present Illness: Suzanne Erickson is a 51 y.o. female ***  ? ?Activities of Daily Living:  ?Patient reports morning stiffness for *** {minute/hour:19697}.   ?Patient {ACTIONS;DENIES/REPORTS:21021675::"Denies"} nocturnal pain.  ?Difficulty dressing/grooming: {ACTIONS;DENIES/REPORTS:21021675::"Denies"} ?Difficulty climbing stairs: {ACTIONS;DENIES/REPORTS:21021675::"Denies"} ?Difficulty getting out of chair: {ACTIONS;DENIES/REPORTS:21021675::"Denies"} ?Difficulty using hands for taps, buttons, cutlery, and/or writing: {ACTIONS;DENIES/REPORTS:21021675::"Denies"} ? ?No Rheumatology ROS completed.  ? ?PMFS History:  ?Patient Active Problem List  ? Diagnosis Date Noted  ?? Primary osteoarthritis of both hands 06/10/2021  ?? Primary osteoarthritis of both knees 06/10/2021  ?? Primary osteoarthritis of both feet 06/10/2021  ?? Long-term use of high-risk medication 08/04/2017  ?? Rheumatoid arthritis involving multiple sites with positive rheumatoid factor (HCC) 05/07/2016  ?? Low back pain 10/28/2012  ?? Allergic rhinitis 10/23/2011  ?? Anxiety 04/16/2011  ?  ?Past Medical History:  ?Diagnosis Date  ?? Anxiety   ?? Rheumatoid arthritis (HCC)   ?  ?Family History  ?Problem Relation Age of Onset  ?? Hypertension Mother   ?? Osteoarthritis Mother   ?? Hypertension Father   ?? Skin cancer Father   ?? Atrial fibrillation Father   ?? Healthy Sister   ?? Healthy Daughter   ?? Healthy Daughter   ? ?Past Surgical History:  ?Procedure Laterality Date  ?? AUGMENTATION MAMMAPLASTY    ? Saline, behind the muscle, augmentation was over 15 years ago.  ?? BACK SURGERY    ? ?Social History  ? ?Social History Narrative  ?? Not on file  ? ?Immunization History   ?Administered Date(s) Administered  ?? Influenza, Seasonal, Injecte, Preservative Fre 06/29/2013  ?? Influenza,inj,Quad PF,6+ Mos 06/14/2020  ?? PFIZER Comirnaty(Gray Top)Covid-19 Tri-Sucrose Vaccine 01/24/2021  ?? PFIZER(Purple Top)SARS-COV-2 Vaccination 10/19/2019, 11/09/2019, 07/26/2020  ?? Td 01/26/2004  ?? Tdap 10/14/2007, 06/14/2020  ?? Zoster Recombinat (Shingrix) 12/21/2020  ?  ? ?Objective: ?Vital Signs: There were no vitals taken for this visit.  ? ?Physical Exam  ? ?Musculoskeletal Exam: *** ? ?CDAI Exam: ?CDAI Score: -- ?Patient Global: --; Provider Global: -- ?Swollen: --; Tender: -- ?Joint Exam 12/27/2021  ? ?No joint exam has been documented for this visit  ? ?There is currently no information documented on the homunculus. Go to the Rheumatology activity and complete the homunculus joint exam. ? ?Investigation: ?No additional findings. ? ?Imaging: ?No results found. ? ?Recent Labs: ?Lab Results  ?Component Value Date  ? WBC 3.9 01/18/2021  ? HGB 14.6 01/18/2021  ? PLT 190 01/18/2021  ? NA 140 01/18/2021  ? K 4.1 01/18/2021  ? CL 102 01/18/2021  ? CO2 27 01/18/2021  ? GLUCOSE 82 01/18/2021  ? BUN 9 01/18/2021  ? CREATININE 0.60 01/18/2021  ? BILITOT 0.6 01/18/2021  ? AST 35 01/18/2021  ? ALT 34 (H) 01/18/2021  ? PROT 7.3 01/18/2021  ? CALCIUM 9.9 01/18/2021  ? GFRAA 121 01/18/2021  ? ? ?Speciality Comments: No specialty comments available. ? ?Procedures:  ?No procedures performed ?Allergies: Amoxicillin and Sulfa antibiotics  ? ?Assessment / Plan:     ?Visit Diagnoses: No diagnosis found. ? ?Orders: ?No orders of the defined types were placed in  this encounter. ? ?No orders of the defined types were placed in this encounter. ? ? ?Face-to-face time spent with patient was *** minutes. Greater than 50% of time was spent in counseling and coordination of care. ? ?Follow-Up Instructions: No follow-ups on file. ? ? ?Ellen Henri, CMA ? ?Note - This record has been created using [employer].  ?Chart  creation errors have been sought, but may not always  ?have been located. Such creation errors do not reflect on  ?the standard of medical care.  ?

## 2021-12-17 NOTE — Progress Notes (Deleted)
? ?Office Visit Note ? ?Patient: Suzanne Erickson             ?Date of Birth: October 23, 1970           ?MRN: [id number]             ?PCP: Samuel Bouche, NP ?Referring: Samuel Bouche, NP ?Visit Date: 12/31/2021 ?Occupation: @GUAROCC @ ? ?Subjective:  ?No chief complaint on file. ? ? ?History of Present Illness: Suzanne Erickson is a 51 y.o. female ***  ? ?Activities of Daily Living:  ?Patient reports morning stiffness for *** {minute/hour:19697}.   ?Patient {ACTIONS;DENIES/REPORTS:21021675::"Denies"} nocturnal pain.  ?Difficulty dressing/grooming: {ACTIONS;DENIES/REPORTS:21021675::"Denies"} ?Difficulty climbing stairs: {ACTIONS;DENIES/REPORTS:21021675::"Denies"} ?Difficulty getting out of chair: {ACTIONS;DENIES/REPORTS:21021675::"Denies"} ?Difficulty using hands for taps, buttons, cutlery, and/or writing: {ACTIONS;DENIES/REPORTS:21021675::"Denies"} ? ?No Rheumatology ROS completed.  ? ?PMFS History:  ?Patient Active Problem List  ? Diagnosis Date Noted  ? Primary osteoarthritis of both hands 06/10/2021  ? Primary osteoarthritis of both knees 06/10/2021  ? Primary osteoarthritis of both feet 06/10/2021  ? Long-term use of high-risk medication 08/04/2017  ? Rheumatoid arthritis involving multiple sites with positive rheumatoid factor ([place]) 05/07/2016  ? Low back pain 10/28/2012  ? Allergic rhinitis 10/23/2011  ? Anxiety 04/16/2011  ?  ?Past Medical History:  ?Diagnosis Date  ? Anxiety   ? Rheumatoid arthritis ([place])   ?  ?Family History  ?Problem Relation Age of Onset  ? Hypertension Mother   ? Osteoarthritis Mother   ? Hypertension Father   ? Skin cancer Father   ? Atrial fibrillation Father   ? Healthy Sister   ? Healthy Daughter   ? Healthy Daughter   ? ?Past Surgical History:  ?Procedure Laterality Date  ? AUGMENTATION MAMMAPLASTY    ? Saline, behind the muscle, augmentation was over 15 years ago.  ? BACK SURGERY    ? ?Social History  ? ?Social History Narrative  ? Not on file  ? ?Immunization History  ?Administered Date(s)  Administered  ? Influenza, Seasonal, Injecte, Preservative Fre 06/29/2013  ? Influenza,inj,Quad PF,6+ Mos 06/14/2020  ? PFIZER Comirnaty(Gray Top)Covid-19 Tri-Sucrose Vaccine 01/24/2021  ? PFIZER(Purple Top)SARS-COV-2 Vaccination 10/19/2019, 11/09/2019, 07/26/2020  ? Td 01/26/2004  ? Tdap 10/14/2007, 06/14/2020  ? Zoster Recombinat (Shingrix) 12/21/2020  ?  ? ?Objective: ?Vital Signs: There were no vitals taken for this visit.  ? ?Physical Exam  ? ?Musculoskeletal Exam: *** ? ?CDAI Exam: ?CDAI Score: -- ?Patient Global: --; Provider Global: -- ?Swollen: --; Tender: -- ?Joint Exam 12/31/2021  ? ?No joint exam has been documented for this visit  ? ?There is currently no information documented on the homunculus. Go to the Rheumatology activity and complete the homunculus joint exam. ? ?Investigation: ?No additional findings. ? ?Imaging: ?No results found. ? ?Recent Labs: ?Lab Results  ?Component Value Date  ? WBC 3.9 01/18/2021  ? HGB 14.6 01/18/2021  ? PLT 190 01/18/2021  ? NA 140 01/18/2021  ? K 4.1 01/18/2021  ? CL 102 01/18/2021  ? CO2 27 01/18/2021  ? GLUCOSE 82 01/18/2021  ? BUN 9 01/18/2021  ? CREATININE 0.60 01/18/2021  ? BILITOT 0.6 01/18/2021  ? AST 35 01/18/2021  ? ALT 34 (H) 01/18/2021  ? PROT 7.3 01/18/2021  ? CALCIUM 9.9 01/18/2021  ? GFRAA 121 01/18/2021  ? ? ?Speciality Comments: No specialty comments available. ? ?Procedures:  ?No procedures performed ?Allergies: Amoxicillin and Sulfa antibiotics  ? ?Assessment / Plan:     ?Visit Diagnoses: No diagnosis found. ? ?Orders: ?No orders of the defined types were placed in  this encounter. ? ?No orders of the defined types were placed in this encounter. ? ? ?Face-to-face time spent with patient was *** minutes. Greater than 50% of time was spent in counseling and coordination of care. ? ?Follow-Up Instructions: No follow-ups on file. ? ? ?Earnestine Mealing, CMA ? ?Note - This record has been created using [employer].  ?Chart creation errors have been  sought, but may not always  ?have been located. Such creation errors do not reflect on  ?the standard of medical care.  ?

## 2021-12-18 ENCOUNTER — Other Ambulatory Visit: Payer: Self-pay | Admitting: Medical-Surgical

## 2021-12-27 ENCOUNTER — Ambulatory Visit: Payer: BC Managed Care – PPO | Admitting: Rheumatology

## 2021-12-27 DIAGNOSIS — R5383 Other fatigue: Secondary | ICD-10-CM

## 2021-12-27 DIAGNOSIS — Z13820 Encounter for screening for osteoporosis: Secondary | ICD-10-CM

## 2021-12-27 DIAGNOSIS — R030 Elevated blood-pressure reading, without diagnosis of hypertension: Secondary | ICD-10-CM

## 2021-12-27 DIAGNOSIS — Z780 Asymptomatic menopausal state: Secondary | ICD-10-CM

## 2021-12-27 DIAGNOSIS — M19072 Primary osteoarthritis, left ankle and foot: Secondary | ICD-10-CM

## 2021-12-27 DIAGNOSIS — Z79899 Other long term (current) drug therapy: Secondary | ICD-10-CM

## 2021-12-27 DIAGNOSIS — M19041 Primary osteoarthritis, right hand: Secondary | ICD-10-CM

## 2021-12-27 DIAGNOSIS — G8929 Other chronic pain: Secondary | ICD-10-CM

## 2021-12-27 DIAGNOSIS — M79641 Pain in right hand: Secondary | ICD-10-CM

## 2021-12-27 DIAGNOSIS — M79671 Pain in right foot: Secondary | ICD-10-CM

## 2021-12-27 DIAGNOSIS — Z8659 Personal history of other mental and behavioral disorders: Secondary | ICD-10-CM

## 2021-12-27 DIAGNOSIS — M0579 Rheumatoid arthritis with rheumatoid factor of multiple sites without organ or systems involvement: Secondary | ICD-10-CM

## 2021-12-31 ENCOUNTER — Ambulatory Visit: Payer: BC Managed Care – PPO | Admitting: Rheumatology

## 2021-12-31 DIAGNOSIS — R5383 Other fatigue: Secondary | ICD-10-CM

## 2021-12-31 DIAGNOSIS — M79671 Pain in right foot: Secondary | ICD-10-CM

## 2021-12-31 DIAGNOSIS — M19071 Primary osteoarthritis, right ankle and foot: Secondary | ICD-10-CM

## 2021-12-31 DIAGNOSIS — M79641 Pain in right hand: Secondary | ICD-10-CM

## 2021-12-31 DIAGNOSIS — M19041 Primary osteoarthritis, right hand: Secondary | ICD-10-CM

## 2021-12-31 DIAGNOSIS — Z79899 Other long term (current) drug therapy: Secondary | ICD-10-CM

## 2021-12-31 DIAGNOSIS — R030 Elevated blood-pressure reading, without diagnosis of hypertension: Secondary | ICD-10-CM

## 2021-12-31 DIAGNOSIS — G8929 Other chronic pain: Secondary | ICD-10-CM

## 2021-12-31 DIAGNOSIS — Z13820 Encounter for screening for osteoporosis: Secondary | ICD-10-CM

## 2021-12-31 DIAGNOSIS — M0579 Rheumatoid arthritis with rheumatoid factor of multiple sites without organ or systems involvement: Secondary | ICD-10-CM

## 2021-12-31 DIAGNOSIS — Z8659 Personal history of other mental and behavioral disorders: Secondary | ICD-10-CM

## 2022-01-07 NOTE — Progress Notes (Deleted)
Office Visit Note  Patient: Suzanne Erickson             Date of Birth: [birth year]           MRN: [id number]             PCP: Samuel Bouche, NP Referring: Samuel Bouche, NP Visit Date: 01/21/2022 Occupation: @GUAROCC @  Subjective:  No chief complaint on file.   History of Present Illness: Suzanne Erickson is a 51 y.o. female ***   Activities of Daily Living:  Patient reports morning stiffness for *** {minute/hour:19697}.   Patient {ACTIONS;DENIES/REPORTS:21021675::"Denies"} nocturnal pain.  Difficulty dressing/grooming: {ACTIONS;DENIES/REPORTS:21021675::"Denies"} Difficulty climbing stairs: {ACTIONS;DENIES/REPORTS:21021675::"Denies"} Difficulty getting out of chair: {ACTIONS;DENIES/REPORTS:21021675::"Denies"} Difficulty using hands for taps, buttons, cutlery, and/or writing: {ACTIONS;DENIES/REPORTS:21021675::"Denies"}  No Rheumatology ROS completed.   PMFS History:  Patient Active Problem List   Diagnosis Date Noted   Primary osteoarthritis of both hands 06/10/2021   Primary osteoarthritis of both knees 06/10/2021   Primary osteoarthritis of both feet 06/10/2021   Long-term use of high-risk medication 08/04/2017   Rheumatoid arthritis involving multiple sites with positive rheumatoid factor ([place]) 05/07/2016   Low back pain 10/28/2012   Allergic rhinitis 10/23/2011   Anxiety 04/16/2011    Past Medical History:  Diagnosis Date   Anxiety    Rheumatoid arthritis ([place])     Family History  Problem Relation Age of Onset   Hypertension Mother    Osteoarthritis Mother    Hypertension Father    Skin cancer Father    Atrial fibrillation Father    Healthy Sister    Healthy Daughter    Healthy Daughter    Past Surgical History:  Procedure Laterality Date   AUGMENTATION MAMMAPLASTY     Saline, behind the muscle, augmentation was over 15 years ago.   BACK SURGERY     Social History   Social History Narrative   Not on file   Immunization History  Administered Date(s)  Administered   Influenza, Seasonal, Injecte, Preservative Fre 06/29/2013   Influenza,inj,Quad PF,6+ Mos 06/14/2020   PFIZER Comirnaty(Gray Top)Covid-19 Tri-Sucrose Vaccine 01/24/2021   PFIZER(Purple Top)SARS-COV-2 Vaccination 10/19/2019, 11/09/2019, 07/26/2020   Td 01/26/2004   Tdap 10/14/2007, 06/14/2020   Zoster Recombinat (Shingrix) 12/21/2020     Objective: Vital Signs: There were no vitals taken for this visit.   Physical Exam   Musculoskeletal Exam: ***  CDAI Exam: CDAI Score: -- Patient Global: --; Provider Global: -- Swollen: --; Tender: -- Joint Exam 01/21/2022   No joint exam has been documented for this visit   There is currently no information documented on the homunculus. Go to the Rheumatology activity and complete the homunculus joint exam.  Investigation: No additional findings.  Imaging: No results found.  Recent Labs: Lab Results  Component Value Date   WBC 3.9 01/18/2021   HGB 14.6 01/18/2021   PLT 190 01/18/2021   NA 140 01/18/2021   K 4.1 01/18/2021   CL 102 01/18/2021   CO2 27 01/18/2021   GLUCOSE 82 01/18/2021   BUN 9 01/18/2021   CREATININE 0.60 01/18/2021   BILITOT 0.6 01/18/2021   AST 35 01/18/2021   ALT 34 (H) 01/18/2021   PROT 7.3 01/18/2021   CALCIUM 9.9 01/18/2021   GFRAA 121 01/18/2021    Speciality Comments: No specialty comments available.  Procedures:  No procedures performed Allergies: Amoxicillin and Sulfa antibiotics   Assessment / Plan:     Visit Diagnoses: No diagnosis found.  Orders: No orders of the defined types were placed in  this encounter.  No orders of the defined types were placed in this encounter.   Face-to-face time spent with patient was *** minutes. Greater than 50% of time was spent in counseling and coordination of care.  Follow-Up Instructions: No follow-ups on file.   Earnestine Mealing, CMA  Note - This record has been created using Editor, [employer].  Chart creation errors have been  sought, but may not always  have been located. Such creation errors do not reflect on  the standard of medical care.

## 2022-01-12 NOTE — Progress Notes (Signed)
? ?Office Visit Note ? ?Patient: Suzanne Erickson             ?Date of Birth: 1971/03/11           ?MRN: [id number]             ?PCP: Samuel Bouche, NP ?Referring: Samuel Bouche, NP ?Visit Date: 01/24/2022 ?Occupation: '@GUAROCC' @ ? ?Subjective:  ?Left knee pain ? ?History of Present Illness: Malynn Lucy is a 51 y.o. female , previous patient of Dr. Franki Monte with history of rheumatoid arthritis.  She returns today after her last visit on January 18, 2021.  She states she has been taking hydroxychloroquine on a regular basis.  She was getting it through her PCP.  She has been experiencing increased pain and discomfort in her bilateral knee joints and bilateral ankles.  Her left knee has been swelling. ? ? ? ?Activities of Daily Living:  ?Patient reports morning stiffness for 20 minutes.   ?Patient Denies nocturnal pain.  ?Difficulty dressing/grooming: Denies ?Difficulty climbing stairs: Denies ?Difficulty getting out of chair: Denies ?Difficulty using hands for taps, buttons, cutlery, and/or writing: Denies ? ?Review of Systems  ?Constitutional:  Positive for fatigue.  ?HENT:  Negative for mouth dryness.   ?Eyes:  Positive for dryness.  ?Respiratory:  Negative for shortness of breath.   ?Cardiovascular:  Negative for swelling in legs/feet.  ?Gastrointestinal:  Negative for constipation.  ?Endocrine: Negative for excessive thirst.  ?Genitourinary:  Negative for difficulty urinating.  ?Musculoskeletal:  Positive for joint pain, joint pain, joint swelling and morning stiffness.  ?Skin:  Negative for rash.  ?Allergic/Immunologic: Negative for susceptible to infections.  ?Neurological:  Negative for numbness.  ?Hematological:  Negative for bruising/bleeding tendency.  ?Psychiatric/Behavioral:  Negative for sleep disturbance.   ? ?PMFS History:  ?Patient Active Problem List  ? Diagnosis Date Noted  ? Primary osteoarthritis of both hands 06/10/2021  ? Primary osteoarthritis of both knees 06/10/2021  ? Primary osteoarthritis of both  feet 06/10/2021  ? Long-term use of high-risk medication 08/04/2017  ? Rheumatoid arthritis involving multiple sites with positive rheumatoid factor ([place]) 05/07/2016  ? Low back pain 10/28/2012  ? Allergic rhinitis 10/23/2011  ? Anxiety 04/16/2011  ?  ?Past Medical History:  ?Diagnosis Date  ? Anxiety   ? Rheumatoid arthritis ([place])   ?  ?Family History  ?Problem Relation Age of Onset  ? Hypertension Mother   ? Osteoarthritis Mother   ? Hypertension Father   ? Skin cancer Father   ? Atrial fibrillation Father   ? Healthy Sister   ? Healthy Daughter   ? Healthy Daughter   ? ?Past Surgical History:  ?Procedure Laterality Date  ? AUGMENTATION MAMMAPLASTY    ? Saline, behind the muscle, augmentation was over 15 years ago.  ? BACK SURGERY    ? ?Social History  ? ?Social History Narrative  ? Not on file  ? ?Immunization History  ?Administered Date(s) Administered  ? Influenza, Seasonal, Injecte, Preservative Fre 06/29/2013  ? Influenza,inj,Quad PF,6+ Mos 06/14/2020  ? PFIZER Comirnaty(Gray Top)Covid-19 Tri-Sucrose Vaccine 01/24/2021  ? PFIZER(Purple Top)SARS-COV-2 Vaccination 10/19/2019, 11/09/2019, 07/26/2020  ? Td 01/26/2004  ? Tdap 10/14/2007, 06/14/2020  ? Zoster Recombinat (Shingrix) 12/21/2020  ?  ? ?Objective: ?Vital Signs: BP 131/88 (BP Location: Left Arm, Patient Position: Sitting, Cuff Size: Normal)   Pulse 73   Resp 14   Ht '5\' 4"'  (1.626 m)   Wt 157 lb 6.4 oz (71.4 kg)   BMI 27.02 kg/m?   ? ?Physical Exam ?Vitals  and nursing note reviewed.  ?Constitutional:   ?   Appearance: She is well-developed.  ?HENT:  ?   Head: Normocephalic and atraumatic.  ?Eyes:  ?   Conjunctiva/sclera: Conjunctivae normal.  ?Cardiovascular:  ?   Rate and Rhythm: Normal rate and regular rhythm.  ?   Heart sounds: Normal heart sounds.  ?Pulmonary:  ?   Effort: Pulmonary effort is normal.  ?   Breath sounds: Normal breath sounds.  ?Abdominal:  ?   General: Bowel sounds are normal.  ?   Palpations: Abdomen is soft.  ?Musculoskeletal:  ?    Cervical back: Normal range of motion.  ?Lymphadenopathy:  ?   Cervical: No cervical adenopathy.  ?Skin: ?   General: Skin is warm and dry.  ?   Capillary Refill: Capillary refill takes less than 2 seconds.  ?Neurological:  ?   Mental Status: She is alert and oriented to person, place, and time.  ?Psychiatric:     ?   Behavior: Behavior normal.  ?  ? ?Musculoskeletal Exam: C-spine was in good range of motion.  Shoulder joints, elbow joints, wrist joints, MCPs PIPs and DIPs and good range of motion with no synovitis.  Hip joints in good range of motion.  She had warmth and swelling in her left knee joint with small effusion.  Right knee joint was in good range of motion.  She had tenderness on palpation of bilateral ankle joints.  There was no tenderness over MTPs. ? ?CDAI Exam: ?CDAI Score: 3.8  ?Patient Global: 4 mm; Provider Global: 4 mm ?Swollen: 1 ; Tender: 4  ?Joint Exam 01/24/2022  ? ?   Right  Left  ?Knee   Tender  Swollen Tender  ?Ankle   Tender   Tender  ? ? ? ?Investigation: ?No additional findings. ? ?Imaging: ?No results found. ? ?Recent Labs: ?Lab Results  ?Component Value Date  ? WBC 3.9 01/18/2021  ? HGB 14.6 01/18/2021  ? PLT 190 01/18/2021  ? NA 140 01/18/2021  ? K 4.1 01/18/2021  ? CL 102 01/18/2021  ? CO2 27 01/18/2021  ? GLUCOSE 82 01/18/2021  ? BUN 9 01/18/2021  ? CREATININE 0.60 01/18/2021  ? BILITOT 0.6 01/18/2021  ? AST 35 01/18/2021  ? ALT 34 (H) 01/18/2021  ? PROT 7.3 01/18/2021  ? CALCIUM 9.9 01/18/2021  ? GFRAA 121 01/18/2021  ? ?January 18, 2021 ESR 6, CK156, RF 17, anti-CCP negative, ANA negative, TSH normal, vitamin D 39 ? ? ?Speciality Comments: Dr. Franki Monte prescribed Plaquenil in 2015 ? ?Procedures:  ?Large Joint Inj: L knee on 01/24/2022 12:21 PM ?Indications: pain ?Details: 27 G 1.5 in needle, medial approach ? ?Arthrogram: No ? ?Medications: 40 mg triamcinolone acetonide 40 MG/ML; 1.5 mL lidocaine 1 % ?Aspirate: 0 mL ?Outcome: tolerated well, no immediate complications ?Procedure,  treatment alternatives, risks and benefits explained, specific risks discussed. Consent was given by the patient. Immediately prior to procedure a time out was called to verify the correct patient, procedure, equipment, support staff and site/side marked as required. Patient was prepped and draped in the usual sterile fashion.  ? ? ?Allergies: Amoxicillin and Sulfa antibiotics  ? ?Assessment / Plan:     ?Visit Diagnoses: Rheumatoid arthritis involving multiple sites with positive rheumatoid factor ([place]) - dxd by Dr. Franki Monte in 2015.She had been on PLQ 200 mg po bid since 2015.  She returns today after her last visit a year ago.  Patient states that she has been getting hydroxychloroquine through her PCP.  She presents today with increased pain and swelling in her left knee joint.  She is having stiffness in most of her joints especially her ankle joints.  She had tenderness on palpation over ankle joints.  We had detailed discussion regarding more aggressive therapy to control her symptoms.  Different treatment options and their side effects were discussed.  After informed consent was obtained and different treatment options were discussed we decided to proceed with methotrexate.  She was given a handout and consent was taken.  The plan is to start her on methotrexate 6 tablets p.o. weekly along with folic acid 2 mg p.o. daily after the lab results are available.  If the labs are stable in 2 weeks then we will increase the dose to 8 tablets p.o. weekly.  I discussed the option of subcutaneous methotrexate in case she has nausea.  A prescription for Zofran 4 mg p.o. every 6 hours as needed was given.  She was also given a prednisone taper starting at 20 mg and tapering by 5 mg every 2 days as a bridging therapy.  Side effects of prednisone were also discussed. ? ?Long-term use of high-risk medication - Patient was advised to reduce hydroxychloroquine to 200 mg p.o. twice daily Monday to Friday at the last visit.   Patient states that she had recent eye examination and will forward the results to [place].  In anticipation to start her on methotrexate I will obtain following labs today.  She was also advised to get a baseline ch

## 2022-01-21 ENCOUNTER — Ambulatory Visit: Payer: BC Managed Care – PPO | Admitting: Rheumatology

## 2022-01-21 DIAGNOSIS — G8929 Other chronic pain: Secondary | ICD-10-CM

## 2022-01-21 DIAGNOSIS — M0579 Rheumatoid arthritis with rheumatoid factor of multiple sites without organ or systems involvement: Secondary | ICD-10-CM

## 2022-01-21 DIAGNOSIS — R5383 Other fatigue: Secondary | ICD-10-CM

## 2022-01-21 DIAGNOSIS — M79672 Pain in left foot: Secondary | ICD-10-CM

## 2022-01-21 DIAGNOSIS — M79641 Pain in right hand: Secondary | ICD-10-CM

## 2022-01-21 DIAGNOSIS — M19071 Primary osteoarthritis, right ankle and foot: Secondary | ICD-10-CM

## 2022-01-21 DIAGNOSIS — M19041 Primary osteoarthritis, right hand: Secondary | ICD-10-CM

## 2022-01-21 DIAGNOSIS — Z8659 Personal history of other mental and behavioral disorders: Secondary | ICD-10-CM

## 2022-01-21 DIAGNOSIS — Z13820 Encounter for screening for osteoporosis: Secondary | ICD-10-CM

## 2022-01-21 DIAGNOSIS — Z79899 Other long term (current) drug therapy: Secondary | ICD-10-CM

## 2022-01-21 DIAGNOSIS — R030 Elevated blood-pressure reading, without diagnosis of hypertension: Secondary | ICD-10-CM

## 2022-01-24 ENCOUNTER — Encounter: Payer: Self-pay | Admitting: Rheumatology

## 2022-01-24 ENCOUNTER — Ambulatory Visit (HOSPITAL_COMMUNITY)
Admission: RE | Admit: 2022-01-24 | Discharge: 2022-01-24 | Disposition: A | Discharge status: Home / Self Care | Payer: BC Managed Care – PPO | Source: Ambulatory Visit | Attending: Rheumatology | Admitting: Rheumatology

## 2022-01-24 ENCOUNTER — Ambulatory Visit (INDEPENDENT_AMBULATORY_CARE_PROVIDER_SITE_OTHER): Payer: BC Managed Care – PPO | Admitting: Rheumatology

## 2022-01-24 VITALS — BP 131/88 | HR 73 | Resp 14 | Ht 64.0 in | Wt 157.4 lb

## 2022-01-24 DIAGNOSIS — Z79899 Other long term (current) drug therapy: Secondary | ICD-10-CM

## 2022-01-24 DIAGNOSIS — M79641 Pain in right hand: Secondary | ICD-10-CM

## 2022-01-24 DIAGNOSIS — M19041 Primary osteoarthritis, right hand: Secondary | ICD-10-CM | POA: Diagnosis not present

## 2022-01-24 DIAGNOSIS — M19071 Primary osteoarthritis, right ankle and foot: Secondary | ICD-10-CM

## 2022-01-24 DIAGNOSIS — Z13820 Encounter for screening for osteoporosis: Secondary | ICD-10-CM

## 2022-01-24 DIAGNOSIS — M0579 Rheumatoid arthritis with rheumatoid factor of multiple sites without organ or systems involvement: Secondary | ICD-10-CM | POA: Diagnosis not present

## 2022-01-24 DIAGNOSIS — Z780 Asymptomatic menopausal state: Secondary | ICD-10-CM

## 2022-01-24 DIAGNOSIS — M069 Rheumatoid arthritis, unspecified: Secondary | ICD-10-CM | POA: Diagnosis not present

## 2022-01-24 DIAGNOSIS — M19072 Primary osteoarthritis, left ankle and foot: Secondary | ICD-10-CM

## 2022-01-24 DIAGNOSIS — M25462 Effusion, left knee: Secondary | ICD-10-CM | POA: Diagnosis not present

## 2022-01-24 DIAGNOSIS — M170 Bilateral primary osteoarthritis of knee: Secondary | ICD-10-CM

## 2022-01-24 DIAGNOSIS — M19042 Primary osteoarthritis, left hand: Secondary | ICD-10-CM

## 2022-01-24 DIAGNOSIS — M79642 Pain in left hand: Secondary | ICD-10-CM

## 2022-01-24 DIAGNOSIS — Z8659 Personal history of other mental and behavioral disorders: Secondary | ICD-10-CM

## 2022-01-24 MED ORDER — TRIAMCINOLONE ACETONIDE 40 MG/ML IJ SUSP
40.0000 mg | INTRAMUSCULAR | Status: AC | PRN
  Administered 2022-01-24: 40 mg via INTRA_ARTICULAR

## 2022-01-24 MED ORDER — PREDNISONE 5 MG PO TABS: ORAL_TABLET | ORAL | 0 refills | Status: DC

## 2022-01-24 MED ORDER — LIDOCAINE HCL 1 % IJ SOLN
1.5000 mL | INTRAMUSCULAR | Status: AC | PRN
  Administered 2022-01-24: 1.5 mL

## 2022-01-24 MED ORDER — ONDANSETRON HCL 4 MG PO TABS: 4.0000 mg | ORAL_TABLET | Freq: Four times a day (QID) | ORAL | 1 refills | Status: AC | PRN

## 2022-01-24 NOTE — Patient Instructions (Signed)
Methotrexate Tablets What is this medication? METHOTREXATE (METH oh TREX ate) treats inflammatory conditions such as arthritis and psoriasis. It works by decreasing inflammation, which can reduce pain and prevent long-term injury to the joints and skin. It may also be used to treat some types of cancer. It works by slowing down the growth of cancer cells. This medicine may be used for other purposes; ask your health care provider or pharmacist if you have questions. COMMON BRAND NAME(S): Rheumatrex, Trexall What should I tell my care team before I take this medication? They need to know if you have any of these conditions: Fluid in the stomach area or lungs If you often drink alcohol Infection or immune system problems Kidney disease or on hemodialysis Liver disease Low blood counts, like low white cell, platelet, or red cell counts Lung disease Radiation therapy Stomach ulcers Ulcerative colitis An unusual or allergic reaction to methotrexate, other medications, foods, dyes, or preservatives Pregnant or trying to get pregnant Breast-feeding How should I use this medication? Take this medication by mouth with a glass of water. Follow the directions on the prescription label. Take your medication at regular intervals. Do not take it more often than directed. Do not stop taking except on your care team's advice. Make sure you know why you are taking this medication and how often you should take it. If this medication is used for a condition that is not cancer, like arthritis or psoriasis, it should be taken weekly, NOT daily. Taking this medication more often than directed can cause serious side effects, even death. Talk to your care team about safe handling and disposal of this medication. You may need to take special precautions. Talk to your care team about the use of this medication in children. While this medication may be prescribed for selected conditions, precautions do  apply. Overdosage: If you think you have taken too much of this medicine contact a poison control center or emergency room at once. NOTE: This medicine is only for you. Do not share this medicine with others. What if I miss a dose? If you miss a dose, talk with your care team. Do not take double or extra doses. What may interact with this medication? Do not take this medication with any of the following: Acitretin This medication may also interact with the following: Aspirin and aspirin-like medications including salicylates Azathioprine Certain antibiotics like penicillins, tetracycline, and chloramphenicol Certain medications that treat or prevent blood clots like warfarin, apixaban, dabigatran, and rivaroxaban Certain medications for stomach problems like esomeprazole, omeprazole, pantoprazole Cyclosporine Dapsone Diuretics Gold Hydroxychloroquine Live virus vaccines Medications for infection like acyclovir, adefovir, amphotericin B, bacitracin, cidofovir, foscarnet, ganciclovir, gentamicin, pentamidine, vancomycin Mercaptopurine NSAIDs, medications for pain and inflammation, like ibuprofen or naproxen Other cytotoxic agents Pamidronate Pemetrexed Penicillamine Phenylbutazone Phenytoin Probenecid Pyrimethamine Retinoids such as isotretinoin and tretinoin Steroid medications like prednisone or cortisone Sulfonamides like sulfasalazine and trimethoprim/sulfamethoxazole Theophylline Zoledronic acid This list may not describe all possible interactions. Give your health care provider a list of all the medicines, herbs, non-prescription drugs, or dietary supplements you use. Also tell them if you smoke, drink alcohol, or use illegal drugs. Some items may interact with your medicine. What should I watch for while using this medication? Avoid alcoholic drinks. This medication can make you more sensitive to the sun. Keep out of the sun. If you cannot avoid being in the sun, wear  protective clothing and use sunscreen. Do not use sun lamps or tanning beds/booths. You may need   blood work done while you are taking this medication. ?Call your care team for advice if you get a fever, chills or sore throat, or other symptoms of a cold or flu. Do not treat yourself. This medication decreases your body's ability to fight infections. Try to avoid being around people who are sick. ?This medication may increase your risk to bruise or bleed. Call your care team if you notice any unusual bleeding. ?Be careful brushing or flossing your teeth or using a toothpick because you may get an infection or bleed more easily. If you have any dental work done, tell your dentist you are receiving this medication. ?Check with your care team if you get an attack of severe diarrhea, nausea and vomiting, or if you sweat a lot. The loss of too much body fluid can make it dangerous for you to take this medication. ?Talk to your care team about your risk of cancer. You may be more at risk for certain types of cancers if you take this medication. ?Do not become pregnant while taking this medication or for 6 months after stopping it. Women should inform their care team if they wish to become pregnant or think they might be pregnant. Men should not father a child while taking this medication and for 3 months after stopping it. There is potential for serious harm to an unborn child. Talk to your care team for more information. Do not breast-feed an infant while taking this medication or for 1 week after stopping it. ?This medication may make it more difficult to get pregnant or father a child. Talk to your care team if you are concerned about your fertility. ?What side effects may I notice from receiving this medication? ?Side effects that you should report to your care team as soon as possible: ?Allergic reactions--skin rash, itching, hives, swelling of the face, lips, tongue, or throat ?Blood clot--pain, swelling, or warmth  in the leg, shortness of breath, chest pain ?Dry cough, shortness of breath or trouble breathing ?Infection--fever, chills, cough, sore throat, wounds that don't heal, pain or trouble when passing urine, general feeling of discomfort or being unwell ?Kidney injury--decrease in the amount of urine, swelling of the ankles, hands, or feet ?Liver injury--right upper belly pain, loss of appetite, nausea, light-colored stool, dark yellow or brown urine, yellowing of the skin or eyes, unusual weakness or fatigue ?Low red blood cell count--unusual weakness or fatigue, dizziness, headache, trouble breathing ?Redness, blistering, peeling, or loosening of the skin, including inside the mouth ?Seizures ?Unusual bruising or bleeding ?Side effects that usually do not require medical attention (report to your care team if they continue or are bothersome): ?Diarrhea ?Dizziness ?Hair loss ?Nausea ?Pain, redness, or swelling with sores inside the mouth or throat ?Vomiting ?This list may not describe all possible side effects. Call your doctor for medical advice about side effects. You may report side effects to FDA at 1-800-FDA-1088. ?Where should I keep my medication? ?Keep out of the reach of children and pets. ?Store at room temperature between 20 and 25 degrees C (68 and 77 degrees F). Protect from light. Get rid of any unused medication after the expiration date. ?Talk to your care team about how to dispose of unused medication. Special directions may apply. ?NOTE: This sheet is a summary. It may not cover all possible information. If you have questions about this medicine, talk to your doctor, pharmacist, or health care provider. ?? 2023 Elsevier/Gold Standard (2020-11-19 00:00:00) ? ?Standing Labs ?We placed an order  today for your standing lab work.  ? ?Please have your standing labs drawn in 2 weeks x 2 and then every 3 months ? ?If possible, please have your labs drawn 2 weeks prior to your appointment so that the provider  can discuss your results at your appointment. ? ?Please note that you may see your imaging and lab results in MyChart before we have reviewed them. ?We may be awaiting multiple results to interpret others before contacti

## 2022-01-26 NOTE — Progress Notes (Signed)
Liver functions are elevated.  Please advise patient to stop all NSAIDs including Tylenol.  She is should just stay in from alcohol.  Repeat LFTs in 3 weeks.  We would not be able to start methotrexate due to elevated LFTs.

## 2022-01-26 NOTE — Progress Notes (Signed)
Chest x-ray was within normal limits per report.

## 2022-01-27 ENCOUNTER — Other Ambulatory Visit (HOSPITAL_COMMUNITY): Payer: Self-pay

## 2022-01-27 ENCOUNTER — Telehealth: Payer: Self-pay | Admitting: *Deleted

## 2022-01-27 ENCOUNTER — Telehealth: Payer: Self-pay | Admitting: Pharmacist

## 2022-01-27 DIAGNOSIS — Z79899 Other long term (current) drug therapy: Secondary | ICD-10-CM

## 2022-01-27 NOTE — Telephone Encounter (Signed)
-----   Message from Pollyann Savoy, MD sent at 01/26/2022  3:22 PM EDT ----- ?Liver functions are elevated.  Please advise patient to stop all NSAIDs including Tylenol.  She is should just stay in from alcohol.  Repeat LFTs in 3 weeks.  We would not be able to start methotrexate due to elevated LFTs.   ?

## 2022-01-27 NOTE — Telephone Encounter (Signed)
Submitted a Prior Authorization request to [facility] for ENBREL via CoverMyMeds. Will update once we receive a response. ? ? ?Key: [id number] ? ?

## 2022-01-27 NOTE — Telephone Encounter (Addendum)
Please start Enbrel Mini BIV. ? ?Dose: 50 mg SQ every 7 says ? ?Dx: Rheumatoid arthritis (M05.9) ? ?Therapies patient unable to try: ?MTX and leflunomide - frequent alcohol use and elevated LFT ?Avoid Actemra/Kevzara and JAK inhibitors (Rinvoq, Olumiant, Xeljanz) due to elevated LFTs ? ?Current regimen: hydroxychloroquine 200mg  twice daily ? ?Once approved, she is eligible for copay assistance ? ? , PharmD, MPH, BCPS, CPP ?Clinical Pharmacist (Rheumatology and Pulmonology) ? ? ?----- Message from Chesley Mires, MD sent at 01/27/2022 12:41 PM EDT ----- ?I discussed lab results with the patient.  She stated that she has been drinking alcohol.  It will be hard for her to completely give up alcohol.  We discussed that methotrexate or leflunomide will not be a good choice.  She has aggressive rheumatoid ? arthritis.  We discussed the option of Enbrel.  I did discuss Enbrel briefly in the office at the last visit.  We will apply for Enbrel.  If approved we will schedule an earlier appointment to discuss medication and get consent prior to starting on  ?Enbrel. ?

## 2022-01-27 NOTE — Progress Notes (Signed)
I discussed lab results with the patient.  She stated that she has been drinking alcohol.  It will be hard for her to completely give up alcohol.  We discussed that methotrexate or leflunomide will not be a good choice.  She has aggressive rheumatoid arthritis.  We discussed the option of Enbrel.  I did discuss Enbrel briefly in the office at the last visit.  We will apply for Enbrel.  If approved we will schedule an earlier appointment to discuss medication and get consent prior to starting on Enbrel.

## 2022-01-28 LAB — QUANTIFERON-TB GOLD PLUS
Mitogen-NIL: 9.94 IU/mL
NIL: 0.01 IU/mL
QuantiFERON-TB Gold Plus: NEGATIVE
TB1-NIL: 0 IU/mL
TB2-NIL: 0.01 IU/mL

## 2022-01-28 LAB — COMPLETE METABOLIC PANEL WITH GFR
AG Ratio: 1.6 (calc) (ref 1.0–2.5)
ALT: 68 U/L — ABNORMAL HIGH (ref 6–29)
AST: 65 U/L — ABNORMAL HIGH (ref 10–35)
Albumin: 4.6 g/dL (ref 3.6–5.1)
Alkaline phosphatase (APISO): 95 U/L (ref 37–153)
BUN/Creatinine Ratio: 10 (calc) (ref 6–22)
BUN: 6 mg/dL — ABNORMAL LOW (ref 7–25)
CO2: 26 mmol/L (ref 20–32)
Calcium: 9.9 mg/dL (ref 8.6–10.4)
Chloride: 100 mmol/L (ref 98–110)
Creat: 0.58 mg/dL (ref 0.50–1.03)
Globulin: 2.8 g/dL (calc) (ref 1.9–3.7)
Glucose, Bld: 79 mg/dL (ref 65–99)
Potassium: 4.1 mmol/L (ref 3.5–5.3)
Sodium: 140 mmol/L (ref 135–146)
Total Bilirubin: 0.4 mg/dL (ref 0.2–1.2)
Total Protein: 7.4 g/dL (ref 6.1–8.1)
eGFR: 108 mL/min/{1.73_m2} (ref >=60)

## 2022-01-28 LAB — CBC WITH DIFFERENTIAL/PLATELET
Absolute Monocytes: 536 cells/uL (ref 200–950)
Basophils Absolute: 30 cells/uL (ref 0–200)
Basophils Relative: 0.8 %
Eosinophils Absolute: 91 cells/uL (ref 15–500)
Eosinophils Relative: 2.4 %
HCT: 43.6 % (ref 35.0–45.0)
Hemoglobin: 15.1 g/dL (ref 11.7–15.5)
Lymphs Abs: 1657 cells/uL (ref 850–3900)
MCH: 32.5 pg (ref 27.0–33.0)
MCHC: 34.6 g/dL (ref 32.0–36.0)
MCV: 94 fL (ref 80.0–100.0)
MPV: 11.1 fL (ref 7.5–12.5)
Monocytes Relative: 14.1 %
Neutro Abs: 1486 cells/uL — ABNORMAL LOW (ref 1500–7800)
Neutrophils Relative %: 39.1 %
Platelets: 193 10*3/uL (ref 140–400)
RBC: 4.64 10*6/uL (ref 3.80–5.10)
RDW: 12.7 % (ref 11.0–15.0)
Total Lymphocyte: 43.6 %
WBC: 3.8 10*3/uL (ref 3.8–10.8)

## 2022-01-28 LAB — HEPATITIS C ANTIBODY
Hepatitis C Ab: NONREACTIVE
SIGNAL TO CUT-OFF: 0.1 (ref <1.00)

## 2022-01-28 LAB — PROTEIN ELECTROPHORESIS, SERUM, WITH REFLEX
Albumin ELP: 4.5 g/dL (ref 3.8–4.8)
Alpha 1: 0.3 g/dL (ref 0.2–0.3)
Alpha 2: 0.8 g/dL (ref 0.5–0.9)
Beta 2: 0.5 g/dL (ref 0.2–0.5)
Beta Globulin: 0.5 g/dL (ref 0.4–0.6)
Gamma Globulin: 0.9 g/dL (ref 0.8–1.7)
Total Protein: 7.5 g/dL (ref 6.1–8.1)

## 2022-01-28 LAB — HEPATITIS B SURFACE ANTIGEN: Hepatitis B Surface Ag: NONREACTIVE

## 2022-01-28 LAB — IGG, IGA, IGM
IgG (Immunoglobin G), Serum: 1080 mg/dL (ref 600–1640)
IgM, Serum: 81 mg/dL (ref 50–300)
Immunoglobulin A: 247 mg/dL (ref 47–310)

## 2022-01-28 LAB — HEPATITIS B CORE ANTIBODY, IGM: Hep B C IgM: NONREACTIVE

## 2022-01-28 NOTE — Progress Notes (Signed)
All other labs were unremarkable.  We will discuss results at the follow-up visit.

## 2022-01-29 ENCOUNTER — Other Ambulatory Visit (HOSPITAL_COMMUNITY): Payer: Self-pay

## 2022-01-29 NOTE — Telephone Encounter (Signed)
Received notification from [facility] regarding a prior authorization for ENBREL. Authorization has been APPROVED from 01/27/2022 to 01/26/2023.  ? ?Per test claim, PA has not adjudicated at pharmacy level, will attempt to rerun. ? ?Authorization # [id number] ?

## 2022-01-30 ENCOUNTER — Other Ambulatory Visit (HOSPITAL_COMMUNITY): Payer: Self-pay

## 2022-01-30 NOTE — Telephone Encounter (Signed)
Test claim reveals pt's copay is $2463.66 for 28 day supply for Enbrel Mini. Pt can fill at [facility]. ?

## 2022-01-31 NOTE — Telephone Encounter (Addendum)
Patient advised that scheduling team will reach out to get earlier appointment scheduled and we will plan to get her started on Enbrel at that visit as long as she feels comfortable with it. Attempted to enroll her into savings card today. Advised her that it will arrive to her home in 2-3 business days in the form of a debit card and she should hold on to it. She verbalized understanding ? ?Per savings card rep, they will have to run BIV to have her enrolled. Expected turnaround time is 1-2 business days and patient will receive call with BIV results ? ?Suzanne Erickson, PharmD, MPH, BCPS, CPP ?Clinical Pharmacist (Rheumatology and Pulmonology) ?

## 2022-01-31 NOTE — Telephone Encounter (Signed)
Patient is scheduled for Tuesday, 02/04/22 at 8:40 am.   ?

## 2022-01-31 NOTE — Telephone Encounter (Signed)
LMOM for patient to call and schedule a sooner appointment.  ?

## 2022-02-03 ENCOUNTER — Other Ambulatory Visit: Payer: Self-pay | Admitting: Medical-Surgical

## 2022-02-03 NOTE — Progress Notes (Signed)
? ?Office Visit Note ? ?Patient: Suzanne Erickson             ?Date of Birth: [birth year]           ?MRN: [id number]             ?PCP: Christen ButterJessup, Joy, NP ?Referring: Christen ButterJessup, Joy, NP ?Visit Date: 02/04/2022 ?Occupation: @GUAROCC @ ? ?Subjective:  ?New start-enbrel  ? ?History of Present Illness: Suzanne Erickson is a 51 y.o. female with history of seropositive rheumatoid arthritis and osteoarthritis.  Patient presents today to initiate enbrel 50 mg weekly injections.  Patient continues to take Plaquenil 1 tablet by mouth daily prescribed by her PCP.  She reports that she noticed significant improvement in her joint pain and inflammation after having a left knee joint injection on 01/24/2022 as well as taking a prednisone taper.  She has not had any recurrence of left knee swelling since having the cortisone injection performed but continues to experience intermittent mild discomfort in the left knee. ?Patient denies any recent infections or new medical conditions.  She denies having any surgeries planned at this time.  She is ready to start on enbrel today.  ? ? ?Activities of Daily Living:  ?Patient reports morning stiffness for 5 minutes.   ?Patient Denies nocturnal pain.  ?Difficulty dressing/grooming: Denies ?Difficulty climbing stairs: Denies ?Difficulty getting out of chair: Denies ?Difficulty using hands for taps, buttons, cutlery, and/or writing: Denies ? ?Review of Systems  ?Constitutional:  Negative for fatigue.  ?HENT:  Negative for mouth sores, mouth dryness and nose dryness.   ?Eyes:  Negative for pain, itching and dryness.  ?Respiratory:  Negative for shortness of breath and difficulty breathing.   ?Cardiovascular:  Negative for chest pain and palpitations.  ?Gastrointestinal:  Negative for blood in stool, constipation and diarrhea.  ?Endocrine: Negative for increased urination.  ?Genitourinary:  Negative for difficulty urinating.  ?Musculoskeletal:  Positive for morning stiffness. Negative for joint pain, joint  pain, joint swelling, myalgias, muscle tenderness and myalgias.  ?Skin:  Negative for color change, rash and redness.  ?Allergic/Immunologic: Negative for susceptible to infections.  ?Neurological:  Negative for dizziness, numbness, headaches, memory loss and weakness.  ?Hematological:  Negative for bruising/bleeding tendency.  ?Psychiatric/Behavioral:  Negative for confusion.   ? ?PMFS History:  ?Patient Active Problem List  ? Diagnosis Date Noted  ? Primary osteoarthritis of both hands 06/10/2021  ? Primary osteoarthritis of both knees 06/10/2021  ? Primary osteoarthritis of both feet 06/10/2021  ? Long-term use of high-risk medication 08/04/2017  ? Rheumatoid arthritis involving multiple sites with positive rheumatoid factor (HCC) 05/07/2016  ? Low back pain 10/28/2012  ? Allergic rhinitis 10/23/2011  ? Anxiety 04/16/2011  ?  ?Past Medical History:  ?Diagnosis Date  ? Anxiety   ? Rheumatoid arthritis (HCC)   ?  ?Family History  ?Problem Relation Age of Onset  ? Hypertension Mother   ? Osteoarthritis Mother   ? Hypertension Father   ? Skin cancer Father   ? Atrial fibrillation Father   ? Healthy Sister   ? Healthy Daughter   ? Healthy Daughter   ? ?Past Surgical History:  ?Procedure Laterality Date  ? AUGMENTATION MAMMAPLASTY    ? Saline, behind the muscle, augmentation was over 15 years ago.  ? BACK SURGERY    ? ?Social History  ? ?Social History Narrative  ? Not on file  ? ?Immunization History  ?Administered Date(s) Administered  ? Influenza, Seasonal, Injecte, Preservative Fre 06/29/2013  ? Influenza,inj,Quad PF,6+ Mos 06/14/2020  ?  PFIZER Comirnaty(Gray Top)Covid-19 Tri-Sucrose Vaccine 01/24/2021  ? PFIZER(Purple Top)SARS-COV-2 Vaccination 10/19/2019, 11/09/2019, 07/26/2020  ? Td 01/26/2004  ? Tdap 10/14/2007, 06/14/2020  ? Zoster Recombinat (Shingrix) 12/21/2020  ?  ? ?Objective: ?Vital Signs: BP 131/89 (BP Location: Left Arm, Patient Position: Sitting, Cuff Size: Normal)   Pulse 79   Ht 5\' 4"  (1.626 m)    Wt 154 lb 6.4 oz (70 kg)   BMI 26.50 kg/m?   ? ?Physical Exam ?Vitals and nursing note reviewed.  ?Constitutional:   ?   Appearance: She is well-developed.  ?HENT:  ?   Head: Normocephalic and atraumatic.  ?Eyes:  ?   Conjunctiva/sclera: Conjunctivae normal.  ?Cardiovascular:  ?   Rate and Rhythm: Normal rate and regular rhythm.  ?   Heart sounds: Normal heart sounds.  ?Pulmonary:  ?   Effort: Pulmonary effort is normal.  ?   Breath sounds: Normal breath sounds.  ?Abdominal:  ?   General: Bowel sounds are normal.  ?   Palpations: Abdomen is soft.  ?Musculoskeletal:  ?   Cervical back: Normal range of motion.  ?Skin: ?   General: Skin is warm and dry.  ?   Capillary Refill: Capillary refill takes less than 2 seconds.  ?Neurological:  ?   Mental Status: She is alert and oriented to person, place, and time.  ?Psychiatric:     ?   Behavior: Behavior normal.  ?  ? ?Musculoskeletal Exam: C-spine, thoracic spine, lumbar spine have good range of motion.  No midline spinal tenderness or SI joint tenderness.  Shoulder joints, elbow joints, wrist joints, MCPs, PIPs, DIPs have good range of motion with no synovitis.  Complete fist formation bilaterally.  Hip joints have good range of motion with no groin pain.  Knee joints have good range of motion with no warmth or effusion at this time.  Ankle joints have good range of motion with no joint tenderness or synovitis. ? ?CDAI Exam: ?CDAI Score: 0.4  ?Patient Global: 2 mm; Provider Global: 2 mm ?Swollen: 0 ; Tender: 0  ?Joint Exam 02/04/2022  ? ?No joint exam has been documented for this visit  ? ?There is currently no information documented on the homunculus. Go to the Rheumatology activity and complete the homunculus joint exam. ? ?Investigation: ?No additional findings. ? ?Imaging: ?DG Chest 2 View ? ?Result Date: 01/24/2022 ?CLINICAL DATA:  Immunosuppressive therapy for rheumatoid arthritis. EXAM: CHEST - 2 VIEW COMPARISON:  None. FINDINGS: The cardiac silhouette, mediastinal  and hilar contours are normal. The lungs are clear. No pulmonary lesions or pleural effusions. No interstitial changes or nodularity. The bony thorax is intact. IMPRESSION: No acute cardiopulmonary findings. Electronically Signed   By: 01/26/2022 M.D.   On: 01/24/2022 16:14   ? ?Recent Labs: ?Lab Results  ?Component Value Date  ? WBC 3.8 01/24/2022  ? HGB 15.1 01/24/2022  ? PLT 193 01/24/2022  ? NA 140 01/24/2022  ? K 4.1 01/24/2022  ? CL 100 01/24/2022  ? CO2 26 01/24/2022  ? GLUCOSE 79 01/24/2022  ? BUN 6 (L) 01/24/2022  ? CREATININE 0.58 01/24/2022  ? BILITOT 0.4 01/24/2022  ? AST 65 (H) 01/24/2022  ? ALT 68 (H) 01/24/2022  ? PROT 7.4 01/24/2022  ? PROT 7.5 01/24/2022  ? CALCIUM 9.9 01/24/2022  ? GFRAA 121 01/18/2021  ? QFTBGOLDPLUS NEGATIVE 01/24/2022  ? ? ?Speciality Comments: Dr. 01/26/2022 prescribed Plaquenil in 2015 ? ?Procedures:  ?No procedures performed ?Allergies: Amoxicillin and Sulfa antibiotics  ? ? ?Assessment /  Plan:     ?Visit Diagnoses: Rheumatoid arthritis involving multiple sites with positive rheumatoid factor (HCC) - dxd by Dr. Ancil Linsey in 2015.  She had been on PLQ 200 mg po bid since 2015: Patient was last seen in the office on 01/24/2022 at which time she had a left knee joint cortisone injection and was provided a prednisone taper starting at 20 mg tapering by 5 mg every 2 days.  Her rheumatoid arthritis was not adequately controlled on Plaquenil which has been prescribed by her PCP.  The plan today is to initiate Enbrel 50 mg sq injections once weekly.  Indications, contraindications, potential side effects of Enbrel were discussed today in detail.  All questions were addressed and consent was obtained.  She tolerated the first injection in the office with no side effects or injection site reactions.  She she will have updated lab work in 1 month and every 3 months to monitor for drug toxicity.  She will follow-up in the office in 6 to 8 weeks to assess her response.- Plan: Etanercept (ENBREL  MINI) 50 MG/ML SOCT ? ?High risk medication use -Enbrel 50 mg sq injections once weekly: Initiated today on 02/04/2022.  Patient continues to take hydroxychloroquine 200 mg 1 tablet by mouth daily prescri

## 2022-02-04 ENCOUNTER — Other Ambulatory Visit (HOSPITAL_COMMUNITY): Payer: Self-pay

## 2022-02-04 ENCOUNTER — Ambulatory Visit (INDEPENDENT_AMBULATORY_CARE_PROVIDER_SITE_OTHER): Payer: BC Managed Care – PPO | Admitting: Physician Assistant

## 2022-02-04 ENCOUNTER — Encounter: Payer: Self-pay | Admitting: Physician Assistant

## 2022-02-04 VITALS — BP 131/89 | HR 79 | Ht 64.0 in | Wt 154.4 lb

## 2022-02-04 DIAGNOSIS — M0579 Rheumatoid arthritis with rheumatoid factor of multiple sites without organ or systems involvement: Secondary | ICD-10-CM

## 2022-02-04 DIAGNOSIS — Z8659 Personal history of other mental and behavioral disorders: Secondary | ICD-10-CM

## 2022-02-04 DIAGNOSIS — M19041 Primary osteoarthritis, right hand: Secondary | ICD-10-CM

## 2022-02-04 DIAGNOSIS — Z79899 Other long term (current) drug therapy: Secondary | ICD-10-CM

## 2022-02-04 DIAGNOSIS — Z13820 Encounter for screening for osteoporosis: Secondary | ICD-10-CM

## 2022-02-04 DIAGNOSIS — M79642 Pain in left hand: Secondary | ICD-10-CM

## 2022-02-04 DIAGNOSIS — M19071 Primary osteoarthritis, right ankle and foot: Secondary | ICD-10-CM

## 2022-02-04 DIAGNOSIS — M79641 Pain in right hand: Secondary | ICD-10-CM | POA: Diagnosis not present

## 2022-02-04 DIAGNOSIS — M19042 Primary osteoarthritis, left hand: Secondary | ICD-10-CM

## 2022-02-04 DIAGNOSIS — M170 Bilateral primary osteoarthritis of knee: Secondary | ICD-10-CM

## 2022-02-04 DIAGNOSIS — M25462 Effusion, left knee: Secondary | ICD-10-CM

## 2022-02-04 DIAGNOSIS — M19072 Primary osteoarthritis, left ankle and foot: Secondary | ICD-10-CM

## 2022-02-04 MED ORDER — ENBREL MINI 50 MG/ML ~~LOC~~ SOCT
50.0000 mg | SUBCUTANEOUS | 0 refills | Status: DC
  Filled 2022-02-04: qty 12, fill #0
  Filled 2022-02-10: qty 4, 28d supply, fill #0
  Filled 2022-03-04: qty 4, 28d supply, fill #1
  Filled 2022-04-07: qty 4, 28d supply, fill #2

## 2022-02-04 NOTE — Patient Instructions (Addendum)
Please call the Enbrel savings card company at [phone] to complete savings card enrollment.  ? ?Your next ENBREL dose is due on 02/11/22, 02/18/22, and every 7 days thereafter ? ?HOLD ENBREL if you have signs or symptoms of an infection. You can resume once you feel better or back to your baseline. ?HOLD ENBREL if you start antibiotics to treat an infection. ?HOLD ENBREL around the time of surgery/procedures. Your surgeon will be able to provide recommendations on when to hold BEFORE and when you are cleared to RESUME. ? ?Pharmacy information: ?Your prescription will be shipped from [facility]. ?[facility] will reach out to you for the first shipment. His number is [phone] (his direct number [phone]) ?The pharmacy will call for subsequent shipments. Their phone number is [phone] ?They will call to schedule shipment and confirm address. They will mail your medication to your home. ? ?Labs are due in 1 month then every 3 months. ?Lab hours are from Monday to Thursday 1:30-4:30pm and Friday 1:30-4pm. You do not need an appointment if you come for labs during these times. ? ?How to manage an injection site reaction: ?Remember the 5 C's: ?COUNTER - leave on the counter at least 30 minutes but up to overnight to bring medication to room temperature. This may help prevent stinging ?COLD - place something cold (like an ice gel pack or cold water bottle) on the injection site just before cleansing with alcohol. This may help reduce pain ?CLARITIN - use Claritin (generic name is loratadine) for the first two weeks of treatment or the day of, the day before, and the day after injecting. This will help to minimize injection site reactions ?CORTISONE CREAM - apply if injection site is irritated and itching ?CALL ME - if injection site reaction is bigger than the size of your fist, looks infected, blisters, or if you develop hives  ? ?Etanercept Injection ?What is this  medication? ?ETANERCEPT (et a NER sept) treats autoimmune conditions, such as psoriasis and certain types of arthritis. It works by slowing down an overactive immune system. It belongs to a group of medications called TNF inhibitors. ?This medicine may be used for other purposes; ask your health care provider or pharmacist if you have questions. ?COMMON BRAND NAME(S): Enbrel ?What should I tell my care team before I take this medication? ?They need to know if you have any of these conditions: ?Bleeding disorder ?Cancer ?Diabetes ?Granulomatosis with polyangiitis ?Heart failure ?HIV or AIDS ?Immune system problems ?Infection such as tuberculosis (TB) or other bacterial, fungal or viral infections ?Liver disease ?Nervous system problems such as Guillain-Barre syndrome, multiple sclerosis or seizures ?Recent or upcoming vaccine ?An unusual or allergic reaction to etanercept, other medications, latex, rubber, food, dyes, or preservatives ?Pregnant or trying to get pregnant ?Breast-feeding ?How should I use this medication? ?The medication is injected under the skin. You will be taught how to prepare and give it. Take it as directed on the prescription label. Keep taking it unless your care team tells you stop. ?This medication comes with INSTRUCTIONS FOR USE. Ask your pharmacist for directions on how to use this medication. Read the information carefully. Talk to your pharmacist or care team if you have questions. ?If you use a pen, be sure to take off the outer needle cover before using the dose. ?It is important that you put your used needles and syringes in a special sharps container. Do not put them in a trash can. If you do not have  a sharps container, call your pharmacist or care team to get one. ?A special MedGuide will be given to you by the pharmacist with each prescription and refill. Be sure to read this information carefully each time. ?Talk to your care team about the use of this medication in children.  While it may be prescribed for children as young as 41 years of age for selected conditions, precautions do apply. ?Overdosage: If you think you have taken too much of this medicine contact a poison control center or emergency room at once. ?NOTE: This medicine is only for you. Do not share this medicine with others. ?What if I miss a dose? ?If you miss a dose, take it as soon as you can. If it is almost time for your next dose, take only that dose. Do not take double or extra doses. ?What may interact with this medication? ?Do not take this medication with any of the following: ?Biologic medications such as adalimumab, certolizumab, golimumab, infliximab ?Live vaccines ?Rilonacept ?This medication may also interact with the following: ?Abatacept ?Anakinra ?Biologic medications such as anifrolumab, baricitinib, belimumab, canakinumab, natalizumab, rituximab, sarilumab, tocilizumab, tofacitinib, upadacitinib, vedolizumab ?Cyclophosphamide ?Sulfasalazine ?This list may not describe all possible interactions. Give your health care provider a list of all the medicines, herbs, non-prescription drugs, or dietary supplements you use. Also tell them if you smoke, drink alcohol, or use illegal drugs. Some items may interact with your medicine. ?What should I watch for while using this medication? ?Visit your care team for regular checks on your progress. Tell your care team if your symptoms do not start to get better or if they get worse. ?This medication may increase your risk of getting an infection. Call your care team for advice if you get a fever, chills, sore throat, or other symptoms of a cold or flu. Do not treat yourself. Try to avoid being around people who are sick. If you have not had the measles or chickenpox vaccines, tell your care team right away if you are around someone with these viruses. ?You will be tested for tuberculosis (TB) before you start this medication. If your care team prescribes any medication  for TB, you should start taking the TB medication before starting this medication. Make sure to finish the full course of TB medication. ?Avoid taking medications that contain aspirin, acetaminophen, ibuprofen, naproxen, or ketoprofen unless instructed by your care team. These medications may hide fever. ?Talk to your care team about your risk of cancer. You may be more at risk for certain types of cancer if you take this medication. ?This medication can decrease the response to a vaccine. If you need to get vaccinated, tell your care team if you have received this medication. Extra booster doses may be needed. Talk to your care team to see if a different vaccination schedule is needed. ?What side effects may I notice from receiving this medication? ?Side effects that you should report to your care team as soon as possible: ?Allergic reactions--skin rash, itching, hives, swelling of the face, lips, tongue, or throat ?Body pain, tingling, or numbness ?Eye pain, change in vision, vision loss ?Heart failure--shortness of breath, swelling of the ankles, feet, or hands, sudden weight gain, unusual weakness or fatigue ?Infection--fever, chills, cough, sore throat, wounds that don't heal, pain or trouble when passing urine, general feeling of discomfort or being unwell ?Liver injury--right upper belly pain, loss of appetite, nausea, light-colored stool, dark yellow or brown urine, yellowing skin or eyes, unusual weakness or fatigue ?  Low red blood cell level--unusual weakness or fatigue, dizziness, headache, trouble breathing ?Lupus-like syndrome--joint pain, swelling, or stiffness, butterfly-shaped rash on the face, rashes that get worse in the sun, fever, unusual weakness or fatigue ?New or worsening psoriasis--rash with itchy, scaly patches ?Seizures ?Unusual bruising or bleeding ?Weakness in arms and legs ?Side effects that usually do not require medical attention (report to your care team if they continue or are  bothersome): ?Headache ?Pain, redness, or irritation at injection site ?Sinus pain or pressure around the face or forehead ?This list may not describe all possible side effects. Call your doctor for medical advice about s

## 2022-02-04 NOTE — Telephone Encounter (Signed)
Rx for Enbrel Mini sent to [facility] at new start visit. Patient received first dose in clinic today, 02/04/22. Was provided additional sample for 02/11/22 dose as she completes savings card enrollment ? ?Chesley Mires, PharmD, MPH, BCPS, CPP ?Clinical Pharmacist (Rheumatology and Pulmonology) ?

## 2022-02-04 NOTE — Progress Notes (Signed)
Pharmacy Note ? ?Subjective:   ?Patient presents to clinic today to receive first dose of Enbrel and obtain consent for RA. Plan was to start MTX but she has elevated LFTs ? ?Patient running a fever or have signs/symptoms of infection? No ? ?Patient currently on antibiotics for the treatment of infection? No ? ?Patient have any upcoming invasive procedures/surgeries? No ? ?Objective: ?CMP  ?   ?Component Value Date/Time  ? NA 140 01/24/2022 1213  ? K 4.1 01/24/2022 1213  ? CL 100 01/24/2022 1213  ? CO2 26 01/24/2022 1213  ? GLUCOSE 79 01/24/2022 1213  ? BUN 6 (L) 01/24/2022 1213  ? CREATININE 0.58 01/24/2022 1213  ? CALCIUM 9.9 01/24/2022 1213  ? PROT 7.4 01/24/2022 1213  ? PROT 7.5 01/24/2022 1213  ? AST 65 (H) 01/24/2022 1213  ? ALT 68 (H) 01/24/2022 1213  ? BILITOT 0.4 01/24/2022 1213  ? GFRNONAA 105 01/18/2021 0848  ? GFRAA 121 01/18/2021 0848  ? ? ?CBC ?   ?Component Value Date/Time  ? WBC 3.8 01/24/2022 1213  ? RBC 4.64 01/24/2022 1213  ? HGB 15.1 01/24/2022 1213  ? HCT 43.6 01/24/2022 1213  ? PLT 193 01/24/2022 1213  ? MCV 94.0 01/24/2022 1213  ? MCH 32.5 01/24/2022 1213  ? MCHC 34.6 01/24/2022 1213  ? RDW 12.7 01/24/2022 1213  ? LYMPHSABS 1,657 01/24/2022 1213  ? EOSABS 91 01/24/2022 1213  ? BASOSABS 30 01/24/2022 1213  ? ? ?Baseline Immunosuppressant Therapy Labs ?TB GOLD ? ?  Latest Ref Rng & Units 01/24/2022  ? 12:13 PM  ?Quantiferon TB Gold  ?Quantiferon TB Gold Plus NEGATIVE NEGATIVE    ? ?Hepatitis Panel ? ?  Latest Ref Rng & Units 01/24/2022  ? 12:13 PM  ?Hepatitis  ?Hep B Surface Ag NON-REACTIVE NON-REACTIVE    ?Hep B IgM NON-REACTIVE NON-REACTIVE    ?Hep C Ab NON-REACTIVE NON-REACTIVE    ? ?HIV ?No results found for: HIV ?Immunoglobulins ? ?  Latest Ref Rng & Units 01/24/2022  ? 12:13 PM  ?Immunoglobulin Electrophoresis  ?IgA  47 - 310 mg/dL 096    ?IgG 600 - 1,640 mg/dL 2,836    ?IgM 50 - 300 mg/dL 81    ? ?SPEP ? ?  Latest Ref Rng & Units 01/24/2022  ? 12:13 PM  ?Serum Protein Electrophoresis  ?Total  Protein 6.1 - 8.1 g/dL 7.4    ? 7.5    ?Albumin 3.8 - 4.8 g/dL 4.5    ?Alpha-1 0.2 - 0.3 g/dL 0.3    ?Alpha-2 0.5 - 0.9 g/dL 0.8    ?Beta Globulin 0.4 - 0.6 g/dL 0.5    ?Beta 2 0.2 - 0.5 g/dL 0.5    ?Gamma Globulin 0.8 - 1.7 g/dL 0.9    ? ?G6PD ?No results found for: G6PDH ?TPMT ?No results found for: TPMT  ? ?Chest x-ray: 01/24/22 - No acute cardiopulmonary findings. ? ?Assessment/Plan:  ?Demonstrated proper injection technique with Enbrel demo device  Patient able to demonstrate proper injection technique using the teach back method.  Patient self injected in the right lower abdomen with: ? ?Sample Medication: Enbrel Mini prefilled cartridge 50mg /mL ?NDC: [phone] ?Lot: [id number] ?Expiration: 07/2023 ? ?Sample Medication: Enbrel Autotouch injector device (patient to keep) ?NDC: 08/2023 ?Lot: [id number] ?Expiration: 09/28/24 ? ?Patient tolerated well.  Observed for 30 mins in office for adverse reaction and none noted.  ? ?Patient is to return in 1 month for labs and 6-8 weeks for follow-up appointment.  Standing orders placed.  ? ?  Enbrel approved through insurance .   Rx sent to: [facility]: [phone] .  Patient provided with pharmacy phone number and advised that [facility] will reach out for first shipment and then [facility] will reach out for subsequent shipments. ? ?Shew ill continue Enbrel 50mg  SQ once weekly. Her copay card enrollment is in process, so she was provided with one additional cartridge for next week's dose as she completes enrollment process. Patient provided with phone number to complete savings card enrollment. ? ?All questions encouraged and answered.  Instructed patient to call with any further questions or concerns. ? ? , PharmD, MPH, BCPS ?Clinical Pharmacist (Rheumatology and Pulmonology) ? ?02/04/2022 9:50 AM ?

## 2022-02-07 ENCOUNTER — Other Ambulatory Visit (HOSPITAL_COMMUNITY): Payer: Self-pay

## 2022-02-10 ENCOUNTER — Other Ambulatory Visit (HOSPITAL_COMMUNITY): Payer: Self-pay

## 2022-02-10 NOTE — Telephone Encounter (Signed)
Pt reached out and LVM about Enbrel card Friday afternoon, returned call and LVM this morning requesting return call anytime today or tomorrow. Will await f/u. ?

## 2022-02-10 NOTE — Telephone Encounter (Signed)
Delivery instructions have been updated in Progress, medication will be shipped to patient's home address by 02/12/22. ? ?Rx has been processed in [facility] and copay card/PAP info has been provided to the pharmacy. The patient has no copay at this time. ?

## 2022-02-17 ENCOUNTER — Telehealth: Payer: Self-pay | Admitting: Rheumatology

## 2022-02-17 NOTE — Telephone Encounter (Signed)
Please see if the patient has any pictures of the injection site reaction?  Did she inject her abdomen or upper thigh?  Did she apply ice to the injection site? Please clarify if she had any shortness of breath or other systemic adverse effects?   She can try taking Claritin or zyrtec today, tomorrow, and Wednesday and then perform the injection on Wednesday and keep taking claritin/zyrtec for a couple of days after to see if it helps.   If she has another reaction we can discuss other treatment options.

## 2022-02-17 NOTE — Telephone Encounter (Signed)
Patient will send pictures through MyChart, patient injected into the abdomen, no sob or systemic adverse effects, patient verbalized understanding.

## 2022-02-17 NOTE — Telephone Encounter (Signed)
Patient called the office stating she had started Enbrel 2 weeks ago. Patient states the first injection she had no reaction. Patient states after the injection on Tuesday 5/22 she was fine until Thursday, she noticed a large, red, and warm injection site reaction. Patient states it got worse on Friday and even worse on Saturday. Patient states she has taken benadryl, allegra, and used hydrocortisone cream. Patient states she is due for another injection tomorrow. Patient states she would like a call back if she should take her injection tomorrow or stop taking the medication.

## 2022-03-04 ENCOUNTER — Other Ambulatory Visit (HOSPITAL_COMMUNITY): Payer: Self-pay

## 2022-03-11 ENCOUNTER — Ambulatory Visit: Payer: BC Managed Care – PPO | Admitting: Physician Assistant

## 2022-03-12 ENCOUNTER — Other Ambulatory Visit (HOSPITAL_COMMUNITY): Payer: Self-pay

## 2022-03-26 ENCOUNTER — Other Ambulatory Visit: Payer: Self-pay | Admitting: Family Medicine

## 2022-03-26 ENCOUNTER — Other Ambulatory Visit: Payer: Self-pay | Admitting: *Deleted

## 2022-03-26 DIAGNOSIS — Z79899 Other long term (current) drug therapy: Secondary | ICD-10-CM

## 2022-03-26 DIAGNOSIS — Z1231 Encounter for screening mammogram for malignant neoplasm of breast: Secondary | ICD-10-CM

## 2022-03-27 LAB — COMPLETE METABOLIC PANEL WITH GFR
AG Ratio: 1.8 (calc) (ref 1.0–2.5)
ALT: 30 U/L — ABNORMAL HIGH (ref 6–29)
AST: 32 U/L (ref 10–35)
Albumin: 4.6 g/dL (ref 3.6–5.1)
Alkaline phosphatase (APISO): 76 U/L (ref 37–153)
BUN: 9 mg/dL (ref 7–25)
CO2: 28 mmol/L (ref 20–32)
Calcium: 10 mg/dL (ref 8.6–10.4)
Chloride: 100 mmol/L (ref 98–110)
Creat: 0.66 mg/dL (ref 0.50–1.03)
Globulin: 2.6 g/dL (calc) (ref 1.9–3.7)
Glucose, Bld: 129 mg/dL — ABNORMAL HIGH (ref 65–99)
Potassium: 3.9 mmol/L (ref 3.5–5.3)
Sodium: 139 mmol/L (ref 135–146)
Total Bilirubin: 0.7 mg/dL (ref 0.2–1.2)
Total Protein: 7.2 g/dL (ref 6.1–8.1)
eGFR: 105 mL/min/{1.73_m2} (ref >=60)

## 2022-03-27 LAB — CBC WITH DIFFERENTIAL/PLATELET
Absolute Monocytes: 665 cells/uL (ref 200–950)
Basophils Absolute: 30 cells/uL (ref 0–200)
Basophils Relative: 0.6 %
Eosinophils Absolute: 100 cells/uL (ref 15–500)
Eosinophils Relative: 2 %
HCT: 41.3 % (ref 35.0–45.0)
Hemoglobin: 14.2 g/dL (ref 11.7–15.5)
Lymphs Abs: 1880 cells/uL (ref 850–3900)
MCH: 32.7 pg (ref 27.0–33.0)
MCHC: 34.4 g/dL (ref 32.0–36.0)
MCV: 95.2 fL (ref 80.0–100.0)
MPV: 10.8 fL (ref 7.5–12.5)
Monocytes Relative: 13.3 %
Neutro Abs: 2325 cells/uL (ref 1500–7800)
Neutrophils Relative %: 46.5 %
Platelets: 188 10*3/uL (ref 140–400)
RBC: 4.34 10*6/uL (ref 3.80–5.10)
RDW: 12.5 % (ref 11.0–15.0)
Total Lymphocyte: 37.6 %
WBC: 5 10*3/uL (ref 3.8–10.8)

## 2022-03-27 NOTE — Progress Notes (Signed)
CBC WNL.  Glucose is 129. ALT is borderline elevated-30. AST WNL.  Rest of CMP WNL.

## 2022-04-02 ENCOUNTER — Ambulatory Visit: Payer: BC Managed Care – PPO | Admitting: Physician Assistant

## 2022-04-02 ENCOUNTER — Ambulatory Visit (INDEPENDENT_AMBULATORY_CARE_PROVIDER_SITE_OTHER): Payer: BC Managed Care – PPO

## 2022-04-02 DIAGNOSIS — Z1231 Encounter for screening mammogram for malignant neoplasm of breast: Secondary | ICD-10-CM | POA: Diagnosis not present

## 2022-04-03 NOTE — Progress Notes (Signed)
Office Visit Note  Patient: Suzanne Erickson             Date of Birth: 18-Feb-1971           MRN: [id number]             PCP: Samuel Bouche, NP Referring: Samuel Bouche, NP Visit Date: 04/16/2022 Occupation: @GUAROCC @  Subjective:  Medication monitoring   History of Present Illness: Suzanne Erickson is a 51 y.o. female with history of seropositive rheumatoid arthritis and osteoarthritis.  Patient is currently on Enbrel 50 mg sq injections once weekly.  She initiated Enbrel on 02/04/2022.  She is no longer taking plaquenil.  She states after her second Enbrel injection she noticed an injection site reaction but each week the reaction has been less severe.  She states with most recent injection she did not notice any injection site reaction.  She states that overall she has noticed about a 75% improvement in her joint pain and inflammation on Enbrel.  She states that her joint stiffness has improved and has only been lasting a few minutes every morning.  She has not had any nocturnal pain.  Her energy level has improved since starting enbrel. She is currently in the process [place] and will also be starting a new job.  She states she had a significant improvement in the left knee joint pain and swelling after having a cortisone injection on 01/24/2022.  She states that her left knee joint pain returned about 2 weeks ago.  She has not had any recent injury or fall.  She denies any mechanical symptoms.  She attributes the increased discomfort due to being more active while getting ready to move.  She states that she will be leaving for [place] soon and will be walking more that usual during that time.  She states when she returns from abroad she is scheduled for a tummy tuck and liposuction as well.   She denies any new medical conditions. She denies any recent or recurrent infections.   Activities of Daily Living:  Patient reports morning stiffness for a few minutes.   Patient  Denies nocturnal pain.  Difficulty dressing/grooming: Denies Difficulty climbing stairs: Denies Difficulty getting out of chair: Denies Difficulty using hands for taps, buttons, cutlery, and/or writing: Denies  Review of Systems  Constitutional:  Positive for fatigue.  HENT:  Negative for mouth sores and mouth dryness.   Eyes:  Negative for dryness.  Respiratory:  Negative for shortness of breath.   Cardiovascular:  Negative for chest pain and palpitations.  Gastrointestinal:  Negative for blood in stool, constipation and diarrhea.  Endocrine: Negative for increased urination.  Genitourinary:  Negative for involuntary urination.  Musculoskeletal:  Positive for joint pain, joint pain, joint swelling and morning stiffness. Negative for myalgias, muscle weakness, muscle tenderness and myalgias.  Skin:  Negative for color change, rash, hair loss and sensitivity to sunlight.  Allergic/Immunologic: Negative for susceptible to infections.  Neurological:  Negative for dizziness and headaches.  Hematological:  Negative for swollen glands.  Psychiatric/Behavioral:  Negative for depressed mood and sleep disturbance. The patient is not nervous/anxious.     PMFS History:  Patient Active Problem List   Diagnosis Date Noted   Primary osteoarthritis of both hands 06/10/2021   Primary osteoarthritis of both knees 06/10/2021   Primary osteoarthritis of both feet 06/10/2021   Long-term use of high-risk medication 08/04/2017   Rheumatoid arthritis involving multiple sites with positive rheumatoid factor ([place])  05/07/2016   Low back pain 10/28/2012   Allergic rhinitis 10/23/2011   Anxiety 04/16/2011    Past Medical History:  Diagnosis Date   Anxiety    Rheumatoid arthritis (HCC)     Family History  Problem Relation Age of Onset   Hypertension Mother    Osteoarthritis Mother    Hypertension Father    Skin cancer Father    Atrial fibrillation Father    Healthy Sister    Healthy Daughter     Healthy Daughter    Past Surgical History:  Procedure Laterality Date   AUGMENTATION MAMMAPLASTY     Saline, behind the muscle, augmentation was over 15 years ago.   BACK SURGERY     Social History   Social History Narrative   Not on file   Immunization History  Administered Date(s) Administered   Influenza, Seasonal, Injecte, Preservative Fre 06/29/2013   Influenza,inj,Quad PF,6+ Mos 06/14/2020   PFIZER Comirnaty(Gray Top)Covid-19 Tri-Sucrose Vaccine 01/24/2021   PFIZER(Purple Top)SARS-COV-2 Vaccination 10/19/2019, 11/09/2019, 07/26/2020   Td 01/26/2004   Tdap 10/14/2007, 06/14/2020   Zoster Recombinat (Shingrix) 12/21/2020     Objective: Vital Signs: BP (!) 143/92 (BP Location: Left Arm, Patient Position: Sitting, Cuff Size: Normal)   Pulse 81   Ht 5\' 4"  (1.626 m)   Wt 155 lb 9.6 oz (70.6 kg)   BMI 26.71 kg/m    Physical Exam Vitals and nursing note reviewed.  Constitutional:      Appearance: She is well-developed.  HENT:     Head: Normocephalic and atraumatic.  Eyes:     Conjunctiva/sclera: Conjunctivae normal.  Cardiovascular:     Rate and Rhythm: Normal rate and regular rhythm.     Heart sounds: Normal heart sounds.  Pulmonary:     Effort: Pulmonary effort is normal.     Breath sounds: Normal breath sounds.  Abdominal:     General: Bowel sounds are normal.     Palpations: Abdomen is soft.  Musculoskeletal:     Cervical back: Normal range of motion.  Skin:    General: Skin is warm and dry.     Capillary Refill: Capillary refill takes less than 2 seconds.  Neurological:     Mental Status: She is alert and oriented to person, place, and time.  Psychiatric:        Behavior: Behavior normal.      Musculoskeletal Exam: C-spine, thoracic spine, lumbar spine and good range of motion.  Shoulder joints, elbow joints, wrist joints, MCPs, PIPs, DIPs have good range of motion with no synovitis.  She was able to make a complete fist bilaterally.  PIP and DIP  thickening consistent with osteoarthritis of both hands.  Hip joints have good range of motion with no groin pain.  Right knee joint has good range of motion with no warmth or effusion.  Left knee joint has some synovial thickening and warmth.  Ankle joints have good range of motion with no tenderness or joint swelling.  No tenderness over MTP joints.  Tailor bunions noted bilaterally.  Some PIP and DIP thickening consistent with osteoarthritis of both feet noted.  CDAI Exam: CDAI Score: 1.4  Patient Global: 2 mm; Provider Global: 2 mm Swollen: 0 ; Tender: 1  Joint Exam 04/16/2022      Right  Left  Knee      Tender     Investigation: No additional findings.  Imaging: MM 3D SCREEN BREAST W/IMPLANT BILATERAL  Result Date: 04/03/2022 CLINICAL DATA:  Screening. EXAM: DIGITAL SCREENING  BILATERAL MAMMOGRAM WITH IMPLANTS, CAD AND TOMOSYNTHESIS TECHNIQUE: Bilateral screening digital craniocaudal and mediolateral oblique mammograms were obtained. Bilateral screening digital breast tomosynthesis was performed. The images were evaluated with computer-aided detection. Standard and/or implant displaced views were performed. COMPARISON:  Previous exam(s). ACR Breast Density Category b: There are scattered areas of fibroglandular density. FINDINGS: The patient has retropectoral implants. There are no findings suspicious for malignancy. IMPRESSION: No mammographic evidence of malignancy. A result letter of this screening mammogram will be mailed directly to the patient. RECOMMENDATION: Screening mammogram in one year. (Code:SM-B-01Y) BI-RADS CATEGORY  1:  Negative. Electronically Signed   By: Harmon Pier M.D.   On: 04/03/2022 14:11    Recent Labs: Lab Results  Component Value Date   WBC 5.0 03/26/2022   HGB 14.2 03/26/2022   PLT 188 03/26/2022   NA 139 03/26/2022   K 3.9 03/26/2022   CL 100 03/26/2022   CO2 28 03/26/2022   GLUCOSE 129 (H) 03/26/2022   BUN 9 03/26/2022   CREATININE 0.66 03/26/2022    BILITOT 0.7 03/26/2022   AST 32 03/26/2022   ALT 30 (H) 03/26/2022   PROT 7.2 03/26/2022   CALCIUM 10.0 03/26/2022   GFRAA 121 01/18/2021   QFTBGOLDPLUS NEGATIVE 01/24/2022    Speciality Comments: Dr. Ancil Linsey prescribed Plaquenil in 2015  Procedures:  No procedures performed Allergies: Amoxicillin and Sulfa antibiotics   Assessment / Plan:     Visit Diagnoses: Rheumatoid arthritis involving multiple sites with positive rheumatoid factor (HCC) - dxd by Dr. Ancil Linsey in 2015.  She had been on PLQ 200 mg po bid since 2015: She was initiated on Enbrel on 02/04/2022.  After her second Enbrel injection she had an injection site reaction which has been improving week by week.  With her most recent Enbrel injection she had no injection site reaction or side effects.  She has noticed about a 75% improvement in joint pain and inflammation since initiating enbrel.  She is no longer taking Plaquenil due to inadequate response.   She has no synovitis on examination. She had a left knee joint cortisone injection on 01/24/2022 which provided significant relief.  Her symptoms returned about 2 weeks ago which she feels was exacerbated by an increase in her activity level while [place].  She has some warmth and fullness of the left knee but no effusion was noted.  Discussed the risks of a recurrent cortisone injections within the same joint.   Different treatment options were discussed today for management of left knee joint pain.  Discussed the use of arthritis compression sleeve, applying ice as needed, and Voltaren gel as needed.  A prednisone taper starting 20 mg tapering by 5 mg every 2 days was sent to the pharmacy.  She does not plan on taking a prednisone taper unless she has a flare while abroad. She is undergoing a lot of life changes at this time including moving, changing jobs, and an upcoming trip to [place].  She will also be having plastic surgery in early August.  Patient was advised  to hold Enbrel 10 days to 14 days prior to surgery and she will require clearance by her surgeon to resume Enbrel if there is no signs or symptoms of infection or delayed healing.  She voiced understanding.  She will remain on enbrel 50 mg sq injections once weekly.  She will follow up in 2 months or sooner if needed.   - Plan: predniSONE (DELTASONE) 5 MG tablet  High risk medication use - Enbrel 50 mg sq injections once weekly: Initiated enbrel on 02/04/2022.   Inadequate response to Plaquenil. TB Gold negative on 01/24/2022. CBC and CMP were drawn on 03/26/2022.  Her next lab work will be due in September and every 3 months to monitor for drug toxicity.  Standing orders for CBC and CMP remain in place. She has not had any recent or recurrent infections. Discussed the importance of holding Enbrel if she develops signs or symptoms of an infection and to resume once the infection has completely cleared. Discussed the importance of yearly skin examinations while on Enbrel. Patient was advised to hold enbrel 10-14 days prior to surgery.  She will require clearance by her surgeon prior to resuming enbrel after surgery.   Primary osteoarthritis of both hands: She has PIP and DIP thickening consistent with osteoarthritis of both hands.  No synovitis noted. Complete fist formation bilaterally.   Effusion, left knee: She presents today with ongoing thickening and warmth.  She had a left knee joint cortisone injection on 01/24/2022 which provided significant relief.  Her symptoms returned about 2 weeks ago.  She has been more active packing and getting ready [place] the mountains which she feels has exacerbated her symptoms.  She has an upcoming trip to [place] and will be walking prolonged distances.  Different treatment options were discussed today including using a compression sleeve, applying ice, as well as applying Voltaren gel topically as needed for pain relief.  A prednisone taper starting at 20 mg  tapering by 5 mg every 2 days was also sent to the pharmacy.  She does not plan on starting the prednisone taper unless she has a flare.  She is willing to give Enbrel more time and for [place] to reassess the full efficacy at her follow-up visit in 2 months.  Primary osteoarthritis of both knees: Right knee joint has good range of motion with no discomfort.  No warmth or effusion noted.  She has some fullness and thickening of the left knee joint on examination today.  No effusion noted.    Primary osteoarthritis of both feet: She has good range of motion of both ankle joints with no tenderness or inflammation on examination today.  No tenderness over MTP joints.  Tailor bunions noted bilaterally.  History of anxiety   Orders: No orders of the defined types were placed in this encounter.  Meds ordered this encounter  Medications   predniSONE (DELTASONE) 5 MG tablet    Sig: Take 4 tabs po x 2 days, 3  tabs po x 2 days, 2 tabs po x 2 days, 1  tab po x 2 days    Dispense:  20 tablet    Refill:  0     Follow-Up Instructions: Return in about 2 months (around 06/17/2022) for Rheumatoid arthritis, Osteoarthritis.   Ofilia Neas, PA-C  Note - This record has been created using Dragon software.  Chart creation errors have been sought, but may not always  have been located. Such creation errors do not reflect on  the standard of medical care.

## 2022-04-07 ENCOUNTER — Other Ambulatory Visit (HOSPITAL_COMMUNITY): Payer: Self-pay

## 2022-04-08 ENCOUNTER — Ambulatory Visit: Payer: BC Managed Care – PPO | Admitting: Physician Assistant

## 2022-04-08 ENCOUNTER — Other Ambulatory Visit (HOSPITAL_COMMUNITY): Payer: Self-pay

## 2022-04-16 ENCOUNTER — Encounter: Payer: Self-pay | Admitting: Physician Assistant

## 2022-04-16 ENCOUNTER — Ambulatory Visit (INDEPENDENT_AMBULATORY_CARE_PROVIDER_SITE_OTHER): Payer: BC Managed Care – PPO | Admitting: Physician Assistant

## 2022-04-16 VITALS — BP 143/92 | HR 81 | Ht 64.0 in | Wt 155.6 lb

## 2022-04-16 DIAGNOSIS — Z79899 Other long term (current) drug therapy: Secondary | ICD-10-CM | POA: Diagnosis not present

## 2022-04-16 DIAGNOSIS — M19042 Primary osteoarthritis, left hand: Secondary | ICD-10-CM

## 2022-04-16 DIAGNOSIS — M19041 Primary osteoarthritis, right hand: Secondary | ICD-10-CM | POA: Diagnosis not present

## 2022-04-16 DIAGNOSIS — M170 Bilateral primary osteoarthritis of knee: Secondary | ICD-10-CM

## 2022-04-16 DIAGNOSIS — M25462 Effusion, left knee: Secondary | ICD-10-CM

## 2022-04-16 DIAGNOSIS — M19072 Primary osteoarthritis, left ankle and foot: Secondary | ICD-10-CM

## 2022-04-16 DIAGNOSIS — M19071 Primary osteoarthritis, right ankle and foot: Secondary | ICD-10-CM

## 2022-04-16 DIAGNOSIS — M0579 Rheumatoid arthritis with rheumatoid factor of multiple sites without organ or systems involvement: Secondary | ICD-10-CM | POA: Diagnosis not present

## 2022-04-16 DIAGNOSIS — Z8659 Personal history of other mental and behavioral disorders: Secondary | ICD-10-CM

## 2022-04-16 MED ORDER — PREDNISONE 5 MG PO TABS: ORAL_TABLET | ORAL | 0 refills | Status: AC

## 2022-04-16 NOTE — Patient Instructions (Signed)
Standing Labs We placed an order today for your standing lab work.   Please have your standing labs drawn in September and every 3 months   If possible, please have your labs drawn 2 weeks prior to your appointment so that the provider can discuss your results at your appointment.  Please note that you may see your imaging and lab results in MyChart before we have reviewed them. We may be awaiting multiple results to interpret others before contacting you. Please allow our office up to 72 hours to thoroughly review all of the results before contacting the office for clarification of your results.  We have open lab daily: Monday through Thursday from 1:30-4:30 PM and Friday from 1:30-4:00 PM at the office of Dr. Shaili Deveshwar, [facility].   Please be advised, all patients with office appointments requiring lab work will take precedent over walk-in lab work.  If possible, please come for your lab work on Monday and Friday afternoons, as you may experience shorter wait times. The office is located at [address], [address], [place], [place] [address] No appointment is necessary.   Labs are drawn by [facility]. Please bring your co-pay at the time of your lab draw.  You may receive a bill from [facility] for your lab work.  Please note if you are on Hydroxychloroquine and and an order has been placed for a Hydroxychloroquine level, you will need to have it drawn 4 hours or more after your last dose.  If you wish to have your labs drawn at another location, please call the office 24 hours in advance to send orders.  If you have any questions regarding directions or hours of operation,  please call [phone].   As a reminder, please drink plenty of water prior to coming for your lab work. Thanks!  

## 2022-04-24 DIAGNOSIS — B354 Tinea corporis: Secondary | ICD-10-CM | POA: Diagnosis not present

## 2022-04-24 DIAGNOSIS — B353 Tinea pedis: Secondary | ICD-10-CM | POA: Diagnosis not present

## 2022-04-28 ENCOUNTER — Other Ambulatory Visit: Payer: Self-pay | Admitting: Physician Assistant

## 2022-04-28 ENCOUNTER — Other Ambulatory Visit (HOSPITAL_COMMUNITY): Payer: Self-pay

## 2022-04-28 DIAGNOSIS — M0579 Rheumatoid arthritis with rheumatoid factor of multiple sites without organ or systems involvement: Secondary | ICD-10-CM

## 2022-04-28 DIAGNOSIS — Z79899 Other long term (current) drug therapy: Secondary | ICD-10-CM

## 2022-04-29 ENCOUNTER — Other Ambulatory Visit (HOSPITAL_COMMUNITY): Payer: Self-pay

## 2022-04-29 MED ORDER — ENBREL MINI 50 MG/ML ~~LOC~~ SOCT
50.0000 mg | SUBCUTANEOUS | 0 refills | Status: DC
  Filled 2022-04-29: qty 12, 84d supply, fill #0
  Filled 2022-05-07: qty 4, 28d supply, fill #0
  Filled 2022-06-09: qty 4, 28d supply, fill #1
  Filled 2022-07-14 – 2022-08-04 (×2): qty 4, 28d supply, fill #2

## 2022-04-29 NOTE — Telephone Encounter (Signed)
Next Visit: 06/18/2022  Last Visit: 04/16/2022  Last Fill: 02/04/2022  MO:[id number] arthritis involving multiple sites with positive rheumatoid factor   Current Dose per office note 04/16/2022: Enbrel 50 mg sq injections once weekly: Initiated enbrel on 02/04/2022.    Labs: 03/26/2022 CBC WNL.  Glucose is 129. ALT is borderline elevated-30. AST WNL.  Rest of CMP WNL.   TB Gold: 01/24/2022   Okay to refill Enbrel?

## 2022-05-06 ENCOUNTER — Other Ambulatory Visit (HOSPITAL_COMMUNITY): Payer: Self-pay

## 2022-05-07 ENCOUNTER — Other Ambulatory Visit (HOSPITAL_COMMUNITY): Payer: Self-pay

## 2022-05-19 ENCOUNTER — Other Ambulatory Visit (HOSPITAL_COMMUNITY): Payer: Self-pay

## 2022-06-04 NOTE — Progress Notes (Deleted)
Office Visit Note  Patient: Suzanne Erickson             Date of Birth: [birth year]           MRN: [id number]             PCP: Christen Butter, NP Referring: Christen Butter, NP Visit Date: 06/18/2022 Occupation: @GUAROCC @  Subjective:  No chief complaint on file.   History of Present Illness: Suzanne Erickson is a 51 y.o. female ***   Activities of Daily Living:  Patient reports morning stiffness for *** {minute/hour:19697}.   Patient {ACTIONS;DENIES/REPORTS:21021675::"Denies"} nocturnal pain.  Difficulty dressing/grooming: {ACTIONS;DENIES/REPORTS:21021675::"Denies"} Difficulty climbing stairs: {ACTIONS;DENIES/REPORTS:21021675::"Denies"} Difficulty getting out of chair: {ACTIONS;DENIES/REPORTS:21021675::"Denies"} Difficulty using hands for taps, buttons, cutlery, and/or writing: {ACTIONS;DENIES/REPORTS:21021675::"Denies"}  No Rheumatology ROS completed.   PMFS History:  Patient Active Problem List   Diagnosis Date Noted  . Primary osteoarthritis of both hands 06/10/2021  . Primary osteoarthritis of both knees 06/10/2021  . Primary osteoarthritis of both feet 06/10/2021  . Long-term use of high-risk medication 08/04/2017  . Rheumatoid arthritis involving multiple sites with positive rheumatoid factor (HCC) 05/07/2016  . Low back pain 10/28/2012  . Allergic rhinitis 10/23/2011  . Anxiety 04/16/2011    Past Medical History:  Diagnosis Date  . Anxiety   . Rheumatoid arthritis (HCC)     Family History  Problem Relation Age of Onset  . Hypertension Mother   . Osteoarthritis Mother   . Hypertension Father   . Skin cancer Father   . Atrial fibrillation Father   . Healthy Sister   . Healthy Daughter   . Healthy Daughter    Past Surgical History:  Procedure Laterality Date  . AUGMENTATION MAMMAPLASTY     Saline, behind the muscle, augmentation was over 15 years ago.  04/18/2011 BACK SURGERY     Social History   Social History Narrative  . Not on file   Immunization History   Administered Date(s) Administered  . Influenza, Seasonal, Injecte, Preservative Fre 06/29/2013  . Influenza,inj,Quad PF,6+ Mos 06/14/2020  . PFIZER Comirnaty(Gray Top)Covid-19 Tri-Sucrose Vaccine 01/24/2021  . PFIZER(Purple Top)SARS-COV-2 Vaccination 10/19/2019, 11/09/2019, 07/26/2020  . Td 01/26/2004  . Tdap 10/14/2007, 06/14/2020  . Zoster Recombinat (Shingrix) 12/21/2020     Objective: Vital Signs: There were no vitals taken for this visit.   Physical Exam   Musculoskeletal Exam: ***  CDAI Exam: CDAI Score: -- Patient Global: --; Provider Global: -- Swollen: --; Tender: -- Joint Exam 06/18/2022   No joint exam has been documented for this visit   There is currently no information documented on the homunculus. Go to the Rheumatology activity and complete the homunculus joint exam.  Investigation: No additional findings.  Imaging: No results found.  Recent Labs: Lab Results  Component Value Date   WBC 5.0 03/26/2022   HGB 14.2 03/26/2022   PLT 188 03/26/2022   NA 139 03/26/2022   K 3.9 03/26/2022   CL 100 03/26/2022   CO2 28 03/26/2022   GLUCOSE 129 (H) 03/26/2022   BUN 9 03/26/2022   CREATININE 0.66 03/26/2022   BILITOT 0.7 03/26/2022   AST 32 03/26/2022   ALT 30 (H) 03/26/2022   PROT 7.2 03/26/2022   CALCIUM 10.0 03/26/2022   GFRAA 121 01/18/2021   QFTBGOLDPLUS NEGATIVE 01/24/2022    Speciality Comments: Dr. 01/26/2022 prescribed Plaquenil in 2015  Procedures:  No procedures performed Allergies: Amoxicillin and Sulfa antibiotics   Assessment / Plan:     Visit Diagnoses: No diagnosis found.  Orders: No  orders of the defined types were placed in this encounter.  No orders of the defined types were placed in this encounter.   Face-to-face time spent with patient was *** minutes. Greater than 50% of time was spent in counseling and coordination of care.  Follow-Up Instructions: No follow-ups on file.   Ellen Henri, CMA  Note - This record  has been created using Animal nutritionist.  Chart creation errors have been sought, but may not always  have been located. Such creation errors do not reflect on  the standard of medical care.

## 2022-06-09 ENCOUNTER — Other Ambulatory Visit (HOSPITAL_COMMUNITY): Payer: Self-pay

## 2022-06-18 ENCOUNTER — Ambulatory Visit: Payer: BC Managed Care – PPO | Admitting: Physician Assistant

## 2022-06-18 DIAGNOSIS — M19042 Primary osteoarthritis, left hand: Secondary | ICD-10-CM

## 2022-06-18 DIAGNOSIS — M25462 Effusion, left knee: Secondary | ICD-10-CM

## 2022-06-18 DIAGNOSIS — Z79899 Other long term (current) drug therapy: Secondary | ICD-10-CM

## 2022-06-18 DIAGNOSIS — M19071 Primary osteoarthritis, right ankle and foot: Secondary | ICD-10-CM

## 2022-06-18 DIAGNOSIS — Z8659 Personal history of other mental and behavioral disorders: Secondary | ICD-10-CM

## 2022-06-18 DIAGNOSIS — M170 Bilateral primary osteoarthritis of knee: Secondary | ICD-10-CM

## 2022-06-18 DIAGNOSIS — M0579 Rheumatoid arthritis with rheumatoid factor of multiple sites without organ or systems involvement: Secondary | ICD-10-CM

## 2022-06-19 ENCOUNTER — Ambulatory Visit: Payer: BC Managed Care – PPO | Admitting: Physician Assistant

## 2022-06-23 ENCOUNTER — Other Ambulatory Visit (HOSPITAL_COMMUNITY): Payer: Self-pay

## 2022-06-24 ENCOUNTER — Other Ambulatory Visit (HOSPITAL_COMMUNITY): Payer: Self-pay

## 2022-07-02 NOTE — Progress Notes (Unsigned)
Office Visit Note  Patient: Suzanne Erickson             Date of Birth: 11-Jun-1971           MRN: [id number]             PCP: Samuel Bouche, NP Referring: Samuel Bouche, NP Visit Date: 07/16/2022 Occupation: @GUAROCC @  Subjective:  Left knee joint pain   History of Present Illness: Suzanne Erickson is a 52 y.o. female with history of seropositive rheumatoid arthritis and osteoarthritis.  She is currently on Enbrel 50 mg sq injections once weekly: Initiated enbrel on 02/04/2022.   She has been tolerating Enbrel without any side effects or injection site reactions.  She has not missed any doses of Enbrel recently.  She denies any recent or recurrent infections.  She is planning on getting the annual flu shot. She denies any signs or symptoms of a rheumatoid arthritis flare. Patient continues to have persistent pain in the left knee joint.  Her left knee joint pain initially started in January 2022 with no injury prior to the onset of symptoms.  Her symptoms have progressively been worsening since then.  Her symptoms are exacerbated by physical activity.  She has continued to notice recurrent left knee joint swelling.  She has been having increased buckling and instability.  She has difficulty standing on 1 leg due to severity of pain and instability.  She rates the pain in her left knee and 8 out of 10 while ambulating.  She had minimal improvement after the prednisone taper prescribed at her last office visit.  She had a cortisone injection on 01/24/2022 which provided significant but temporary relief.  She has tried using Voltaren gel as well as ibuprofen for symptomatic relief.  She is also tried wearing a brace with no improvement in her symptoms. She is not experiencing any other joint pain or joint swelling at this time.    Activities of Daily Living:  Patient reports morning stiffness for 3 minutes.   Patient Denies nocturnal pain.  Difficulty dressing/grooming: Denies Difficulty climbing  stairs: Denies Difficulty getting out of chair: Denies Difficulty using hands for taps, buttons, cutlery, and/or writing: Denies  Review of Systems  Constitutional:  Negative for fatigue.  HENT:  Negative for mouth sores and mouth dryness.   Eyes:  Negative for dryness.  Respiratory:  Negative for shortness of breath.   Cardiovascular:  Negative for chest pain and palpitations.  Gastrointestinal:  Negative for blood in stool, constipation and diarrhea.  Endocrine: Negative for increased urination.  Genitourinary:  Negative for involuntary urination.  Musculoskeletal:  Positive for joint pain, joint pain, joint swelling and morning stiffness. Negative for gait problem, myalgias, muscle weakness, muscle tenderness and myalgias.  Skin:  Negative for color change, rash, hair loss and sensitivity to sunlight.  Allergic/Immunologic: Negative for susceptible to infections.  Neurological:  Negative for dizziness and headaches.  Hematological:  Negative for swollen glands.  Psychiatric/Behavioral:  Negative for depressed mood and sleep disturbance. The patient is not nervous/anxious.     PMFS History:  Patient Active Problem List   Diagnosis Date Noted   Primary osteoarthritis of both hands 06/10/2021   Primary osteoarthritis of both knees 06/10/2021   Primary osteoarthritis of both feet 06/10/2021   Long-term use of high-risk medication 08/04/2017   Rheumatoid arthritis involving multiple sites with positive rheumatoid factor ([place]) 05/07/2016   Low back pain 10/28/2012   Allergic rhinitis 10/23/2011   Anxiety 04/16/2011    Past  Medical History:  Diagnosis Date   Anxiety    Rheumatoid arthritis ([place])     Family History  Problem Relation Age of Onset   Hypertension Mother    Osteoarthritis Mother    Hypertension Father    Skin cancer Father    Atrial fibrillation Father    Healthy Sister    Healthy Daughter    Healthy Daughter    Past Surgical History:  Procedure Laterality  Date   ABDOMINOPLASTY     AUGMENTATION MAMMAPLASTY     Saline, behind the muscle, augmentation was over 15 years ago.   BACK SURGERY     Social History   Social History Narrative   Not on file   Immunization History  Administered Date(s) Administered   Influenza, Seasonal, Injecte, Preservative Fre 06/29/2013   Influenza,inj,Quad PF,6+ Mos 06/14/2020   PFIZER Comirnaty(Gray Top)Covid-19 Tri-Sucrose Vaccine 01/24/2021   PFIZER(Purple Top)SARS-COV-2 Vaccination 10/19/2019, 11/09/2019, 07/26/2020   Td 01/26/2004   Tdap 10/14/2007, 06/14/2020   Zoster Recombinat (Shingrix) 12/21/2020     Objective: Vital Signs: BP (!) 160/93 (BP Location: Right Arm, Patient Position: Sitting, Cuff Size: Normal)   Pulse 70   Resp 15   Ht 5\' 4"  (1.626 m)   Wt 149 lb 12.8 oz (67.9 kg)   BMI 25.71 kg/m    Physical Exam Vitals and nursing note reviewed.  Constitutional:      Appearance: She is well-developed.  HENT:     Head: Normocephalic and atraumatic.  Eyes:     Conjunctiva/sclera: Conjunctivae normal.  Cardiovascular:     Rate and Rhythm: Normal rate and regular rhythm.     Heart sounds: Normal heart sounds.  Pulmonary:     Effort: Pulmonary effort is normal.     Breath sounds: Normal breath sounds.  Abdominal:     General: Bowel sounds are normal.     Palpations: Abdomen is soft.  Musculoskeletal:     Cervical back: Normal range of motion.  Skin:    General: Skin is warm and dry.     Capillary Refill: Capillary refill takes less than 2 seconds.  Neurological:     Mental Status: She is alert and oriented to person, place, and time.  Psychiatric:        Behavior: Behavior normal.      Musculoskeletal Exam: C-spine, thoracic spine, lumbar spine have good range of motion.  No midline spinal tenderness.  No SI joint tenderness.  Shoulder joints, elbow joints, wrist joints, MCPs, PIPs, DIPs have good range of motion with no synovitis.  Complete fist formation bilaterally.  Hip  joints have good range of motion with no groin pain.  Painful range of motion of the left knee joint.  Left knee joint fullness and swelling noted.  Right knee joint has good range of motion with no warmth or effusion.  Ankle joints have good range of motion with no tenderness or synovitis.  CDAI Exam: CDAI Score: 1  Patient Global: 0 mm; Provider Global: 0 mm Swollen: 0 ; Tender: 1  Joint Exam 07/16/2022      Right  Left  Knee      Tender   There is currently no information documented on the homunculus. Go to the Rheumatology activity and complete the homunculus joint exam.  Investigation: No additional findings.  Imaging: No results found.  Recent Labs: Lab Results  Component Value Date   WBC 5.0 03/26/2022   HGB 14.2 03/26/2022   PLT 188 03/26/2022   NA 139 03/26/2022  K 3.9 03/26/2022   CL 100 03/26/2022   CO2 28 03/26/2022   GLUCOSE 129 (H) 03/26/2022   BUN 9 03/26/2022   CREATININE 0.66 03/26/2022   BILITOT 0.7 03/26/2022   AST 32 03/26/2022   ALT 30 (H) 03/26/2022   PROT 7.2 03/26/2022   CALCIUM 10.0 03/26/2022   GFRAA 121 01/18/2021   QFTBGOLDPLUS NEGATIVE 01/24/2022    Speciality Comments: Dr. Franki Monte prescribed Plaquenil in 2015  Procedures:  No procedures performed Allergies: Amoxicillin and Sulfa antibiotics   Assessment / Plan:     Visit Diagnoses: Rheumatoid arthritis involving multiple sites with positive rheumatoid factor ([place]) - dxd by Dr. Franki Monte in 2015.  She had been on PLQ 200 mg po bid since 2015: She was initiated on Enbrel on 02/04/2022.  She has no synovitis on examination today.  She has not had any signs or symptoms of a rheumatoid arthritis flare since her last office visit.  She remains on Enbrel 50 mg sq injections once weekly.  She has been tolerating Enbrel without any side effects or injection site reactions.  She has not had any recent or recurrent infections.  She has had persistent discomfort in the left knee joint which is exacerbated  by physical activity.  She is unsure if her symptoms are due to underlying osteoarthritis, rheumatoid arthritis, or an internal derangement.  She has tried performing lower extremity muscle strengthening exercises as well as has tried to remain active but has difficulty due to the severity of pain and swelling.  The pain in the left knee is an 8 out of 10 while ambulating.  She has been experiencing recurrent left knee joint effusions.  She has tried Voltaren gel, ibuprofen, a brace, oral prednisone, and a cortisone injection but her symptoms continue to recur.  She has noticed increased instability and buckling in the left knee joint as well.  We will proceed with ordering an MRI of the left knee for further evaluation. She will remain on Enbrel as prescribed.  She was advised to notify us if she develops any other joint pain or joint swelling.  She will follow-up in the office in 5 months or sooner if needed.  High risk medication use - Enbrel 50 mg sq injections once weekly: Initiated enbrel on 02/04/2022. - Plan: CBC with Differential/Platelet, COMPLETE METABOLIC PANEL WITH GFR CBC and CMP drawn on 03/26/2022.  Orders for CBC and CMP were released today.  Her next lab work will be in January and every 3 months to monitor for drug toxicity. TB Gold negative on 01/24/2022 and will continue to be monitored yearly. She has not had any recent or recurrent infections.  Discussed the importance of holding Enbrel if she develops signs or symptoms of an infection and to resume once the infection has completely cleared. Inadequate response to Plaquenil.  Primary osteoarthritis of both hands: No tenderness or joint swelling at this time.  Complete fist formation bilaterally.  Effusion, left knee: Patient continues to experience recurrent left knee joint effusions.  Her symptoms initially started in January 2022 and have progressively been worsening.  Her symptoms are exacerbated by physical activity.  Her Pain is an  8 out of 10 while ambulating.  She has difficulty performing knee joint exercises due to the severity of pain and swelling at times.  She has noticed increased instability and buckling intermittently.  She has tried ibuprofen, Voltaren gel, bracing, oral prednisone, and a cortisone injection in the past.  The last cortisone injection was  administered on 01/24/2022 which provided significant relief but her symptoms were temporary. X-rays of the left knee were obtained today for further evaluation.  The plan is to proceed with an MRI to rule out an internal derangement.  Chronic pain of left knee -Patient presents today with ongoing pain in the left knee joint.  She has been having difficulty ambulating due to the severity of pain and swelling.  X-rays of the left knee were updated today.  She is been experiencing instability and buckling so a MRI will be ordered to rule out an internal derangement.  Plan: XR KNEE 3 VIEW LEFT  Primary osteoarthritis of both knees: X-rays of the right knee were consistent with mild to moderate osteoarthritis and mild chondromalacia patella.  X-rays of the left knee from 01/18/2021 were consistent with severe medial compartment narrowing and mild chondromalacia patella.  Previous x-ray results were reviewed with the patient today in the office.  She continues to have persistent pain, instability, buckling, and recurrent left knee joint swelling.  Updated x-rays of the left knee were obtained today.  A MRI will be ordered to rule out an internal derangement.  Primary osteoarthritis of both feet: She is not experiencing any discomfort in her feet at this time. She is wearing proper fitting shoes.   History of anxiety   Orders: Orders Placed This Encounter  Procedures   XR KNEE 3 VIEW LEFT   CBC with Differential/Platelet   COMPLETE METABOLIC PANEL WITH GFR   No orders of the defined types were placed in this encounter.    Follow-Up Instructions: Return in about 5  months (around 12/15/2022) for Rheumatoid arthritis, Osteoarthritis.   Ofilia Neas, PA-C  Note - This record has been created using Dragon software.  Chart creation errors have been sought, but may not always  have been located. Such creation errors do not reflect on  the standard of medical care.

## 2022-07-10 ENCOUNTER — Other Ambulatory Visit (HOSPITAL_COMMUNITY): Payer: Self-pay

## 2022-07-14 ENCOUNTER — Other Ambulatory Visit (HOSPITAL_COMMUNITY): Payer: Self-pay

## 2022-07-16 ENCOUNTER — Other Ambulatory Visit: Payer: Self-pay | Admitting: *Deleted

## 2022-07-16 ENCOUNTER — Ambulatory Visit (INDEPENDENT_AMBULATORY_CARE_PROVIDER_SITE_OTHER): Payer: BC Managed Care – PPO

## 2022-07-16 ENCOUNTER — Encounter: Payer: Self-pay | Admitting: Physician Assistant

## 2022-07-16 ENCOUNTER — Ambulatory Visit: Payer: BC Managed Care – PPO | Attending: Physician Assistant | Admitting: Physician Assistant

## 2022-07-16 VITALS — BP 160/93 | HR 70 | Resp 15 | Ht 64.0 in | Wt 149.8 lb

## 2022-07-16 DIAGNOSIS — M25462 Effusion, left knee: Secondary | ICD-10-CM

## 2022-07-16 DIAGNOSIS — M19072 Primary osteoarthritis, left ankle and foot: Secondary | ICD-10-CM

## 2022-07-16 DIAGNOSIS — G8929 Other chronic pain: Secondary | ICD-10-CM

## 2022-07-16 DIAGNOSIS — M170 Bilateral primary osteoarthritis of knee: Secondary | ICD-10-CM

## 2022-07-16 DIAGNOSIS — Z79899 Other long term (current) drug therapy: Secondary | ICD-10-CM

## 2022-07-16 DIAGNOSIS — M25562 Pain in left knee: Secondary | ICD-10-CM

## 2022-07-16 DIAGNOSIS — M19071 Primary osteoarthritis, right ankle and foot: Secondary | ICD-10-CM

## 2022-07-16 DIAGNOSIS — M19041 Primary osteoarthritis, right hand: Secondary | ICD-10-CM | POA: Diagnosis not present

## 2022-07-16 DIAGNOSIS — M19042 Primary osteoarthritis, left hand: Secondary | ICD-10-CM

## 2022-07-16 DIAGNOSIS — Z8659 Personal history of other mental and behavioral disorders: Secondary | ICD-10-CM

## 2022-07-16 DIAGNOSIS — M0579 Rheumatoid arthritis with rheumatoid factor of multiple sites without organ or systems involvement: Secondary | ICD-10-CM

## 2022-07-16 NOTE — Progress Notes (Signed)
X-rays are consistent with severe osteoarthritis and mild chondromalacia patella.

## 2022-07-17 LAB — CBC WITH DIFFERENTIAL/PLATELET
Absolute Monocytes: 745 cells/uL (ref 200–950)
Basophils Absolute: 40 cells/uL (ref 0–200)
Basophils Relative: 0.8 %
Eosinophils Absolute: 170 cells/uL (ref 15–500)
Eosinophils Relative: 3.4 %
HCT: 41.6 % (ref 35.0–45.0)
Hemoglobin: 13.7 g/dL (ref 11.7–15.5)
Lymphs Abs: 2035 cells/uL (ref 850–3900)
MCH: 30 pg (ref 27.0–33.0)
MCHC: 32.9 g/dL (ref 32.0–36.0)
MCV: 91.2 fL (ref 80.0–100.0)
MPV: 11.2 fL (ref 7.5–12.5)
Monocytes Relative: 14.9 %
Neutro Abs: 2010 cells/uL (ref 1500–7800)
Neutrophils Relative %: 40.2 %
Platelets: 201 10*3/uL (ref 140–400)
RBC: 4.56 10*6/uL (ref 3.80–5.10)
RDW: 12.4 % (ref 11.0–15.0)
Total Lymphocyte: 40.7 %
WBC: 5 10*3/uL (ref 3.8–10.8)

## 2022-07-17 LAB — COMPLETE METABOLIC PANEL WITH GFR
AG Ratio: 1.7 (calc) (ref 1.0–2.5)
ALT: 37 U/L — ABNORMAL HIGH (ref 6–29)
AST: 36 U/L — ABNORMAL HIGH (ref 10–35)
Albumin: 4.7 g/dL (ref 3.6–5.1)
Alkaline phosphatase (APISO): 80 U/L (ref 37–153)
BUN: 9 mg/dL (ref 7–25)
CO2: 30 mmol/L (ref 20–32)
Calcium: 9.4 mg/dL (ref 8.6–10.4)
Chloride: 100 mmol/L (ref 98–110)
Creat: 0.57 mg/dL (ref 0.50–1.03)
Globulin: 2.7 g/dL (calc) (ref 1.9–3.7)
Glucose, Bld: 88 mg/dL (ref 65–99)
Potassium: 4.1 mmol/L (ref 3.5–5.3)
Sodium: 139 mmol/L (ref 135–146)
Total Bilirubin: 0.4 mg/dL (ref 0.2–1.2)
Total Protein: 7.4 g/dL (ref 6.1–8.1)
eGFR: 109 mL/min/{1.73_m2} (ref >=60)

## 2022-07-17 NOTE — Progress Notes (Signed)
CBC WNL.  AST and ALT are borderline elevated.  Please clarify if she is taking any tylenol or alcohol use?

## 2022-07-22 ENCOUNTER — Telehealth: Payer: Self-pay | Admitting: *Deleted

## 2022-07-22 NOTE — Telephone Encounter (Signed)
Patient called the office and left message at 5:02 pm on 07/21/2022. She states she was returning a call from [facility].

## 2022-07-22 NOTE — Telephone Encounter (Signed)
Patient advised her MRI has been declined by insurance.  Dr. Estanislado Pandy recommends seeing an orthopedist to discuss proceeding with a knee replacement in the future.  She has had radiographic progression and has severe osteoarthritis in the left knee.  Patient would like to know if there is anything in the short term that she can do to help. Anything like cortisone injection or gel injections. Please advise.

## 2022-07-22 NOTE — Telephone Encounter (Signed)
I attempted to return the patient's call.  Her MRI has been declined by insurance.  Dr. Estanislado Pandy recommends seeing an orthopedist to discuss proceeding with a knee replacement in the future.  She has had radiographic progression and has severe osteoarthritis in the left knee.

## 2022-07-22 NOTE — Telephone Encounter (Signed)
She only had about 3 months of relief after the last cortisone injection, but we can try 1 more cortisone injection or try applying for visco gel injections.  It is unclear how much of a response she will have due to the severity of the arthritis.

## 2022-07-22 NOTE — Telephone Encounter (Signed)
Attempted to contact the patient and left message for patient to call the office.  

## 2022-07-23 ENCOUNTER — Telehealth: Payer: Self-pay | Admitting: *Deleted

## 2022-07-23 NOTE — Telephone Encounter (Signed)
Patient wants left knee inj instead of visco, appt scheduled 07/25/2022.

## 2022-07-23 NOTE — Telephone Encounter (Signed)
Left message to advise patient she only had about 3 months of relief after the last cortisone injection, but we can try 1 more cortisone injection or try applying for visco gel injections.  It is unclear how much of a response she will have due to the severity of the arthritis. Patient advised to call the office if she would like to proceed.

## 2022-07-25 ENCOUNTER — Ambulatory Visit: Payer: BC Managed Care – PPO | Admitting: Physician Assistant

## 2022-07-28 ENCOUNTER — Ambulatory Visit: Payer: BC Managed Care – PPO | Attending: Physician Assistant | Admitting: Physician Assistant

## 2022-07-28 ENCOUNTER — Telehealth: Payer: Self-pay | Admitting: Pharmacy Technician

## 2022-07-28 ENCOUNTER — Other Ambulatory Visit (HOSPITAL_COMMUNITY): Payer: Self-pay

## 2022-07-28 VITALS — BP 130/90 | HR 76

## 2022-07-28 DIAGNOSIS — M25562 Pain in left knee: Secondary | ICD-10-CM

## 2022-07-28 DIAGNOSIS — Z79899 Other long term (current) drug therapy: Secondary | ICD-10-CM

## 2022-07-28 DIAGNOSIS — M170 Bilateral primary osteoarthritis of knee: Secondary | ICD-10-CM

## 2022-07-28 DIAGNOSIS — M25462 Effusion, left knee: Secondary | ICD-10-CM | POA: Diagnosis not present

## 2022-07-28 DIAGNOSIS — G8929 Other chronic pain: Secondary | ICD-10-CM

## 2022-07-28 DIAGNOSIS — M0579 Rheumatoid arthritis with rheumatoid factor of multiple sites without organ or systems involvement: Secondary | ICD-10-CM

## 2022-07-28 MED ORDER — LIDOCAINE HCL 1 % IJ SOLN
1.5000 mL | INTRAMUSCULAR | Status: AC | PRN
  Administered 2022-07-28: 1.5 mL

## 2022-07-28 MED ORDER — TRIAMCINOLONE ACETONIDE 40 MG/ML IJ SUSP
40.0000 mg | INTRAMUSCULAR | Status: AC | PRN
  Administered 2022-07-28: 40 mg via INTRA_ARTICULAR

## 2022-07-28 NOTE — Telephone Encounter (Signed)
ERROR

## 2022-07-28 NOTE — Telephone Encounter (Signed)
This is a continuation of treatment with Enbrel. She has responded to treatment per last visit note. It is clinically inappropriate to change treatment for this patient at this time.  [name], PharmD, MPH, BCPS, CPP Clinical Pharmacist (Rheumatology and Pulmonology)

## 2022-07-28 NOTE — Progress Notes (Signed)
   Procedure Note  Patient: Suzanne Erickson             Date of Birth: Feb 26, 1971           MRN: [id number]             Visit Date: 07/28/2022  Procedures: Visit Diagnoses:  1. Chronic pain of left knee   2. Effusion, left knee   3. Primary osteoarthritis of both knees     Large Joint Inj on 07/28/2022 8:50 AM Indications: pain Details: 27 G 1.5 in needle, medial approach  Arthrogram: No  Medications: 1.5 mL lidocaine 1 %; 40 mg triamcinolone acetonide 40 MG/ML Aspirate: 0 mL Outcome: tolerated well, no immediate complications Procedure, treatment alternatives, risks and benefits explained, specific risks discussed. Consent was given by the patient. Immediately prior to procedure a time out was called to verify the correct patient, procedure, equipment, support staff and site/side marked as required. Patient was prepped and draped in the usual sterile fashion.      Patient tolerated the procedure well.  Aftercare was discussed.  Hazel Sams, PA-C

## 2022-07-29 ENCOUNTER — Other Ambulatory Visit (HOSPITAL_COMMUNITY): Payer: Self-pay

## 2022-07-29 NOTE — Telephone Encounter (Signed)
Patient Advocate Encounter  Received notification from [employer] that prior authorization for ENBREL MINI is required.   PA submitted on 10.31.23 Via FAX [phone] Status is pending    Luciano Cutter, CPhT Patient Advocate Phone: [phone]

## 2022-07-30 ENCOUNTER — Other Ambulatory Visit (HOSPITAL_COMMUNITY): Payer: Self-pay

## 2022-07-30 NOTE — Telephone Encounter (Signed)
Received fax that patient does not have [employer]. Attempted to re-process on CMM with information that pops up in eligibility but CMM continues to crash. Will have to attempt again later  Phone: [phone]  [name], PharmD, MPH, BCPS, CPP Clinical Pharmacist (Rheumatology and Pulmonology)

## 2022-07-31 ENCOUNTER — Other Ambulatory Visit (HOSPITAL_COMMUNITY): Payer: Self-pay

## 2022-08-01 NOTE — Telephone Encounter (Signed)
Notified by [facility] of possible change in insurance. Submitted a Prior Authorization RENEWAL request to [employer] for ENBREL via CoverMyMeds. Will update once we receive a response.  [name]  Per CMM's automated response: This medication may be excluded from the patient's benefit. For more information, please reach out to [facility] directly at [phone].  [name], PharmD, MPH, BCPS, CPP Clinical Pharmacist (Rheumatology and Pulmonology)

## 2022-08-04 ENCOUNTER — Other Ambulatory Visit (HOSPITAL_COMMUNITY): Payer: Self-pay

## 2022-08-04 MED ORDER — ENBREL MINI 50 MG/ML ~~LOC~~ SOCT: 50.0000 mg | SUBCUTANEOUS | 0 refills | Status: DC

## 2022-08-04 NOTE — Telephone Encounter (Signed)
Patient Advocate Encounter  Called and complete PA via phone.  Prior Authorization for ENBREL MINI 50MG  has been approved.    PA# [id number] Effective dates: 10.07.23 through 11.5.24  Pt must fill with [facility]  Jensyn Cambria B. CPhT P: [phone] F: [phone]

## 2022-08-04 NOTE — Telephone Encounter (Signed)
Rx for Enbrel Mini sent to [facility]: [phone]. Called patient and provided her with pharmacy phone number. She confirmed that she has savings card information. She will plan to call pharmacy tomorrow.  [name], PharmD, MPH, BCPS, CPP Clinical Pharmacist (Rheumatology and Pulmonology)

## 2022-08-12 IMAGING — MG DIGITAL SCREENING BREAST BILAT IMPLANT W/ TOMO W/ CAD
9 of 12 series · 9 of 28 positions shown · non-contrast
Comparison: Previous exam(s).

CLINICAL DATA: Screening.

EXAM:
DIGITAL SCREENING BILATERAL MAMMOGRAM WITH IMPLANTS, CAD AND
TOMOSYNTHESIS
TECHNIQUE: Bilateral screening digital craniocaudal and mediolateral oblique
mammograms were obtained. Bilateral screening digital breast
tomosynthesis was performed. The images were evaluated with
computer-aided detection. Standard and/or implant displaced views
were performed.

[R MLO]
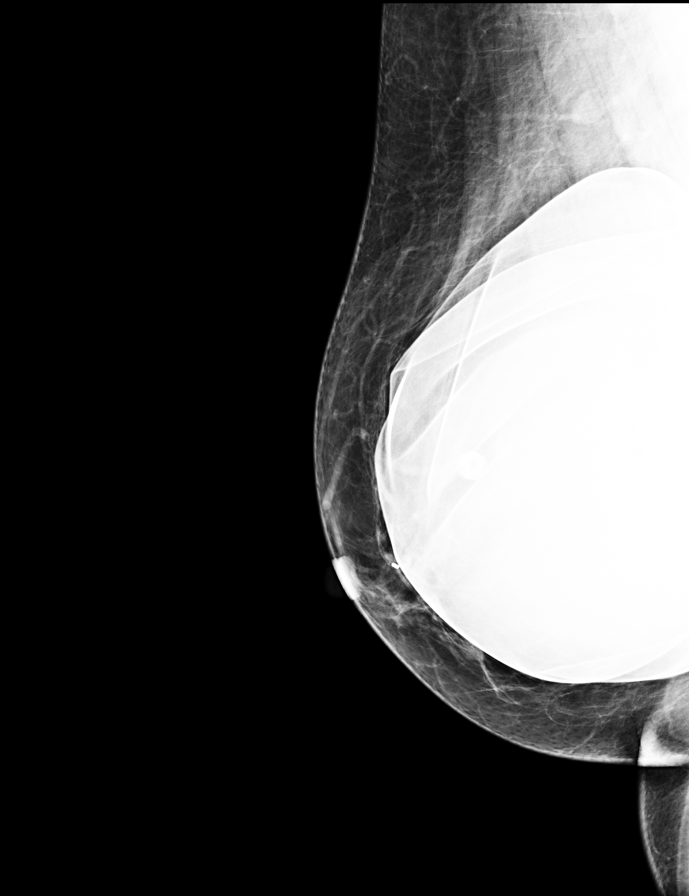

[R CC]
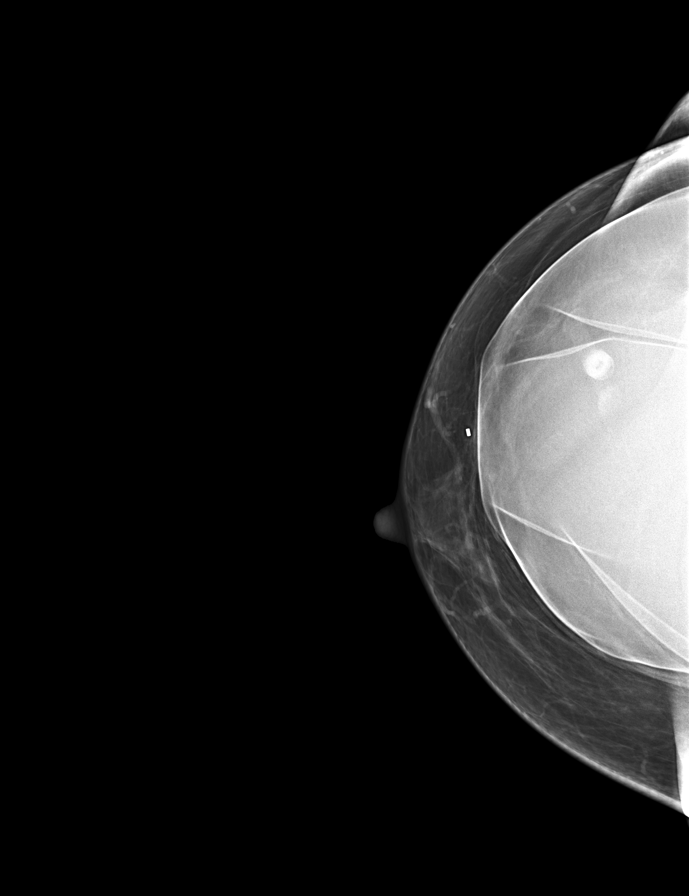

[L CC]
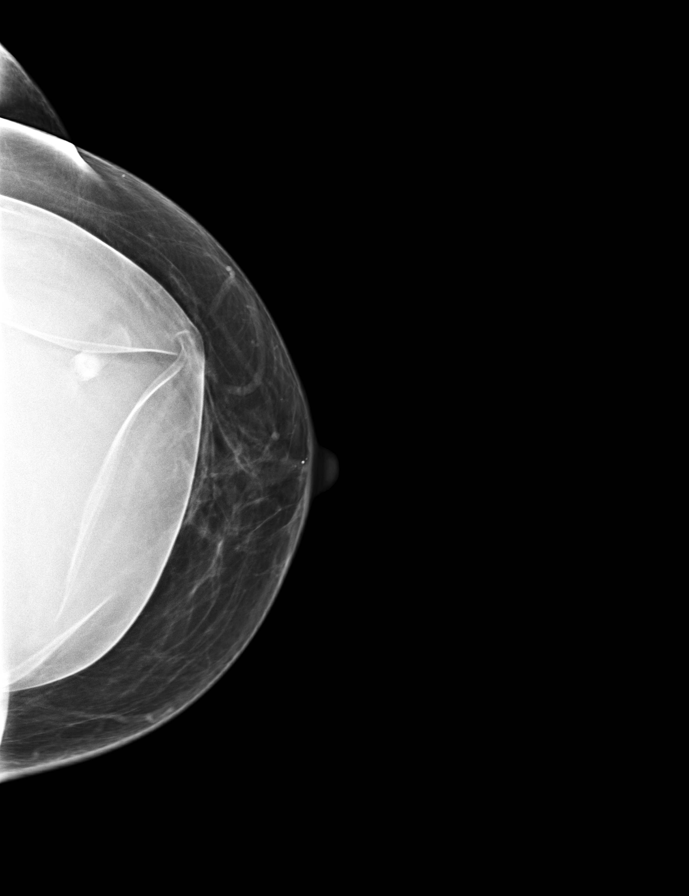

[L MLO]
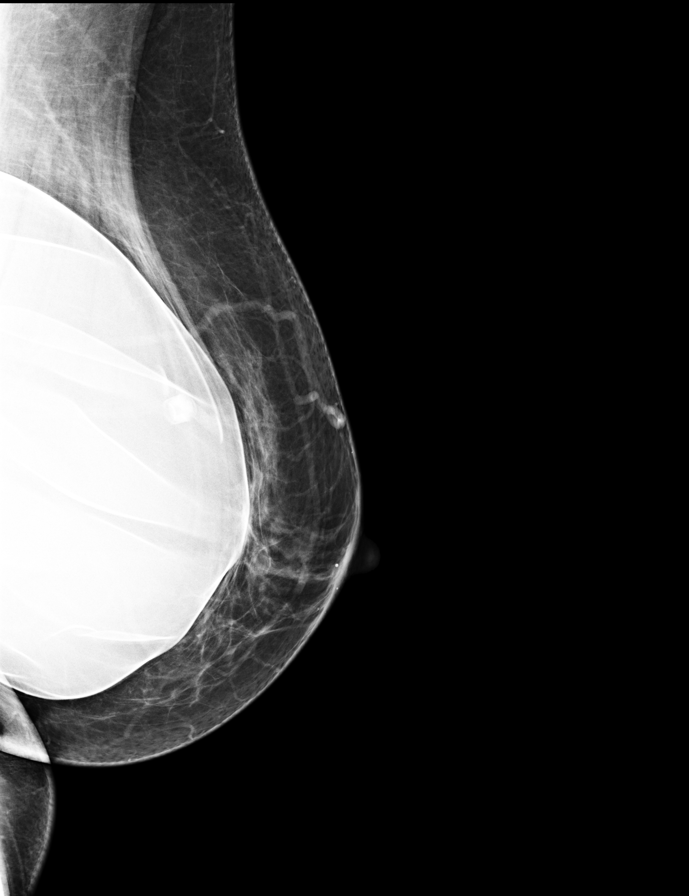

[R MLO synth-2D]
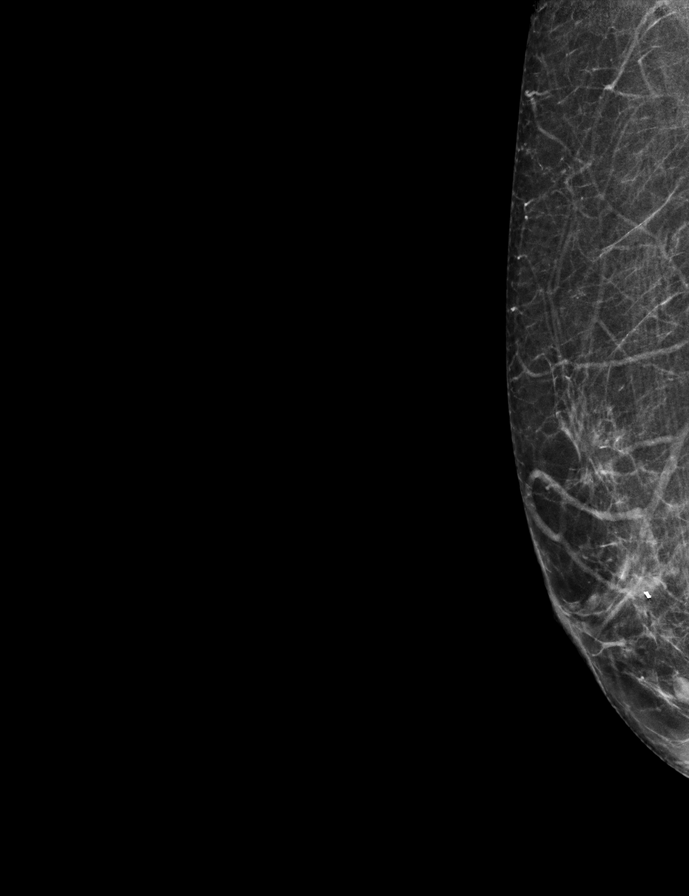

[R CC synth-2D]
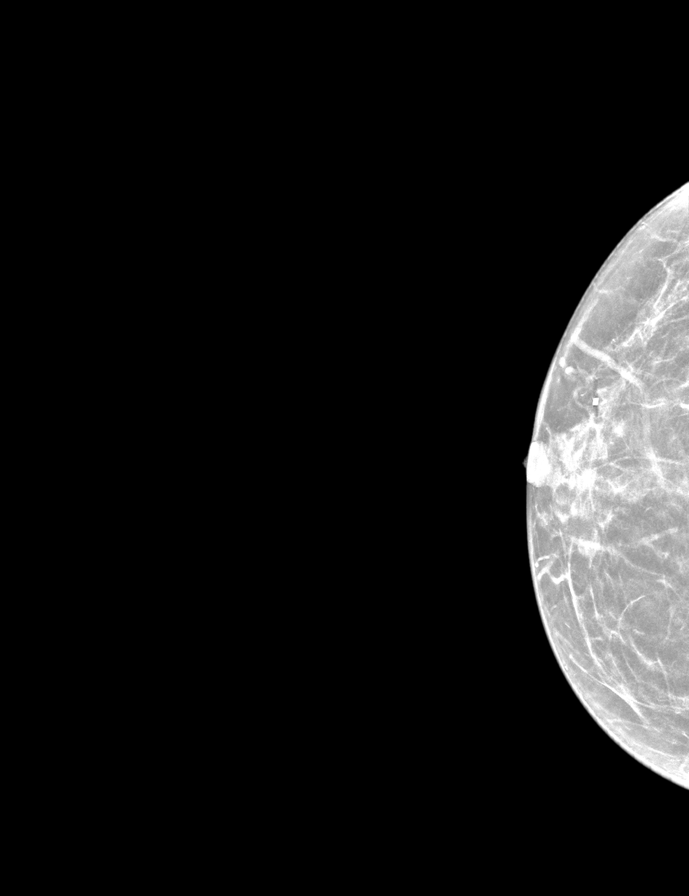

[L CC synth-2D]
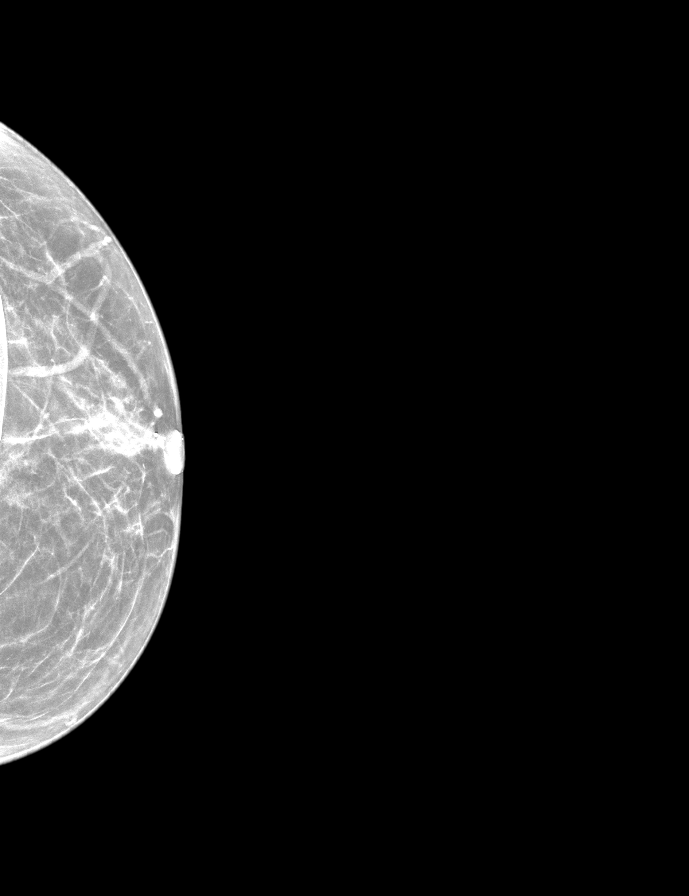

[L MLO synth-2D]
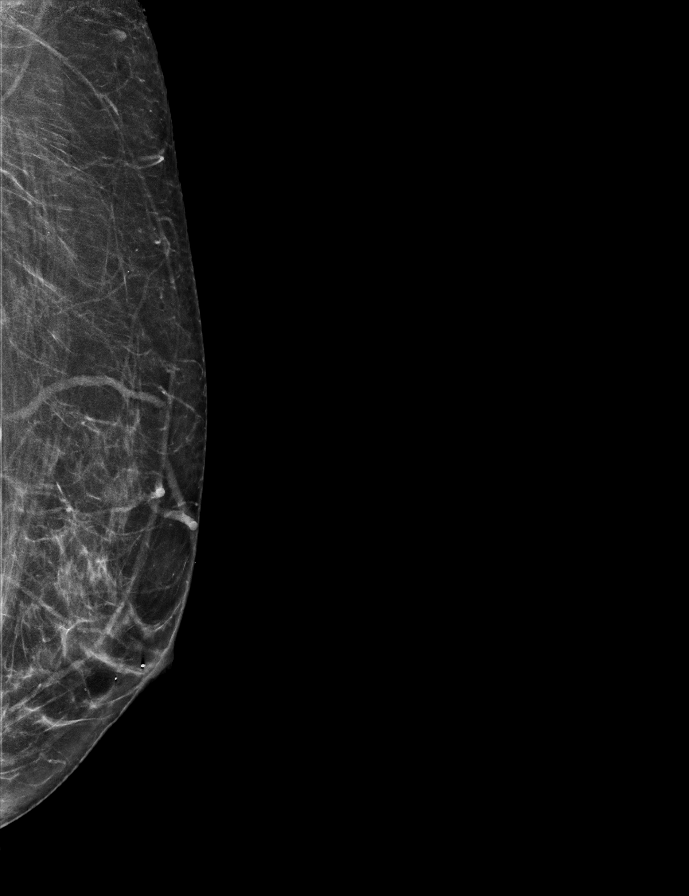

[R MLO tomo · tomo slice 25/50.0]
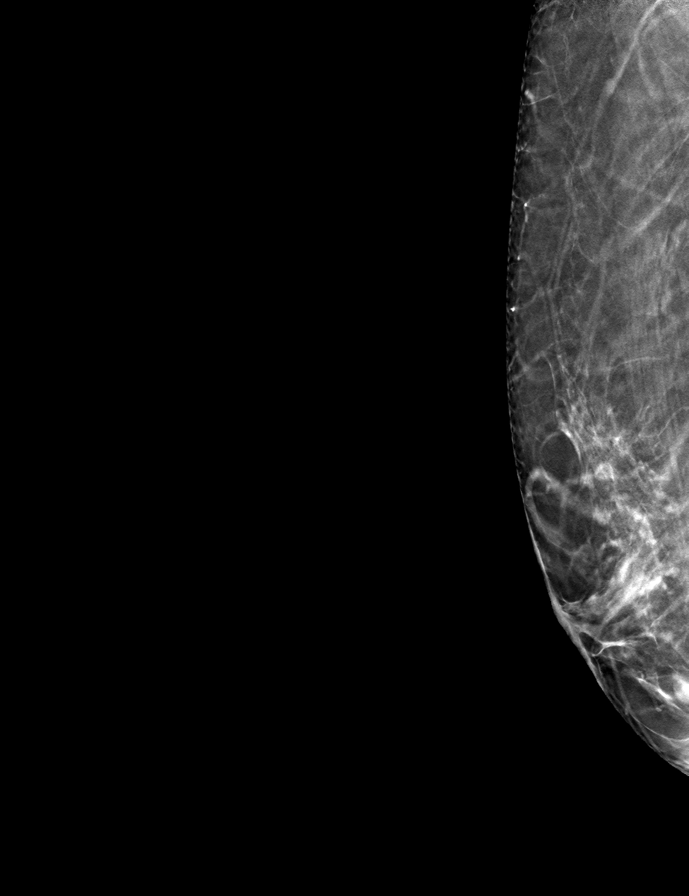

[9 of 28 positions shown; findings below may reference images not displayed]

ACR Breast Density Category b: There are scattered areas of
fibroglandular density.
FINDINGS: The patient has retropectoral implants. There are no findings
suspicious for malignancy.
IMPRESSION: No mammographic evidence of malignancy. A result letter of this
screening mammogram will be mailed directly to the patient.

RECOMMENDATION:
Screening mammogram in one year. (Code:SE-S-JMG)

BI-RADS CATEGORY  1:  Negative.

## 2022-09-05 ENCOUNTER — Telehealth: Payer: Self-pay | Admitting: Rheumatology

## 2022-09-05 NOTE — Telephone Encounter (Signed)
Spoke with patient and advised we could burn a disc with images but we close at 12 today. Patient states she can pick up on Monday around noon.   I returned the call to French Ana at [facility] and advised the patient would bring a disc with images.

## 2022-09-05 NOTE — Telephone Encounter (Signed)
French Ana from [facility] called stating patient has appointment at their office on Monday, 09/08/22.  They can see the left knee x-ray report on 07/16/22, but there is no link to view the images.  French Ana requested a return call #[phone]

## 2022-09-05 NOTE — Telephone Encounter (Signed)
CD at front desk.

## 2022-09-08 DIAGNOSIS — M170 Bilateral primary osteoarthritis of knee: Secondary | ICD-10-CM | POA: Diagnosis not present

## 2022-10-13 ENCOUNTER — Encounter: Payer: Self-pay | Admitting: *Deleted

## 2022-10-13 ENCOUNTER — Other Ambulatory Visit: Payer: Self-pay | Admitting: Physician Assistant

## 2022-10-13 DIAGNOSIS — M0579 Rheumatoid arthritis with rheumatoid factor of multiple sites without organ or systems involvement: Secondary | ICD-10-CM

## 2022-10-13 DIAGNOSIS — Z79899 Other long term (current) drug therapy: Secondary | ICD-10-CM

## 2022-10-13 NOTE — Telephone Encounter (Signed)
Next Visit: 01/01/2023  Last Visit: 07/16/2022  Last Fill: 08/04/2022  [id number] arthritis involving multiple sites with positive rheumatoid factor   Current Dose per office note 07/16/2022: Enbrel 50 mg sq injections once weekly   Labs: 07/16/2022 CBC WNL.AST and ALT are borderline elevated.  TB Gold: 01/24/2022 Neg    Message sent to patient via my chart to advise patient she is due to update her lab work.   Okay to refill Enbrel?

## 2022-11-03 DIAGNOSIS — M1712 Unilateral primary osteoarthritis, left knee: Secondary | ICD-10-CM | POA: Diagnosis not present

## 2022-11-10 ENCOUNTER — Other Ambulatory Visit: Payer: Self-pay | Admitting: Physician Assistant

## 2022-11-10 DIAGNOSIS — J029 Acute pharyngitis, unspecified: Secondary | ICD-10-CM | POA: Diagnosis not present

## 2022-11-10 DIAGNOSIS — R051 Acute cough: Secondary | ICD-10-CM | POA: Diagnosis not present

## 2022-11-10 DIAGNOSIS — M0579 Rheumatoid arthritis with rheumatoid factor of multiple sites without organ or systems involvement: Secondary | ICD-10-CM

## 2022-11-10 DIAGNOSIS — M1712 Unilateral primary osteoarthritis, left knee: Secondary | ICD-10-CM | POA: Diagnosis not present

## 2022-11-10 DIAGNOSIS — Z20822 Contact with and (suspected) exposure to covid-19: Secondary | ICD-10-CM | POA: Diagnosis not present

## 2022-11-10 DIAGNOSIS — Z79899 Other long term (current) drug therapy: Secondary | ICD-10-CM

## 2022-11-10 DIAGNOSIS — U071 COVID-19: Secondary | ICD-10-CM | POA: Diagnosis not present

## 2022-11-14 DIAGNOSIS — Z0000 Encounter for general adult medical examination without abnormal findings: Secondary | ICD-10-CM | POA: Diagnosis not present

## 2022-11-14 DIAGNOSIS — F419 Anxiety disorder, unspecified: Secondary | ICD-10-CM | POA: Diagnosis not present

## 2022-11-14 DIAGNOSIS — M0579 Rheumatoid arthritis with rheumatoid factor of multiple sites without organ or systems involvement: Secondary | ICD-10-CM | POA: Diagnosis not present

## 2022-11-14 DIAGNOSIS — I10 Essential (primary) hypertension: Secondary | ICD-10-CM | POA: Diagnosis not present

## 2022-11-14 DIAGNOSIS — Z13220 Encounter for screening for lipoid disorders: Secondary | ICD-10-CM | POA: Diagnosis not present

## 2022-11-17 DIAGNOSIS — M1712 Unilateral primary osteoarthritis, left knee: Secondary | ICD-10-CM | POA: Diagnosis not present

## 2022-11-19 ENCOUNTER — Other Ambulatory Visit: Payer: Self-pay

## 2022-11-19 DIAGNOSIS — M0579 Rheumatoid arthritis with rheumatoid factor of multiple sites without organ or systems involvement: Secondary | ICD-10-CM

## 2022-11-19 DIAGNOSIS — Z79899 Other long term (current) drug therapy: Secondary | ICD-10-CM

## 2022-11-19 DIAGNOSIS — Z111 Encounter for screening for respiratory tuberculosis: Secondary | ICD-10-CM | POA: Diagnosis not present

## 2022-11-19 NOTE — Telephone Encounter (Signed)
Patient called for medication refill for Enbrel to be sent to [facility].

## 2022-11-20 MED ORDER — ENBREL MINI 50 MG/ML ~~LOC~~ SOCT: SUBCUTANEOUS | 0 refills | Status: AC

## 2022-11-20 NOTE — Addendum Note (Signed)
Addended by: Carole Binning on: 11/20/2022 10:10 AM   Modules accepted: Orders

## 2022-11-20 NOTE — Telephone Encounter (Signed)
Next Visit: 01/01/2023  Last Visit: 07/16/2022  Last Fill: 10/13/2022 (30 day supply)  [id number] arthritis involving multiple sites with positive rheumatoid factor    Current Dose per office note 07/16/2022:  Enbrel 50 mg sq injections once weekly    Labs: 11/14/2022 Hgb 15.5, Hct 45.1, Chloride 97  TB Gold: 01/24/2022 Neg    Okay to refill Enbrel?

## 2022-11-24 ENCOUNTER — Telehealth: Payer: Self-pay | Admitting: Rheumatology

## 2022-11-24 NOTE — Telephone Encounter (Signed)
Attempted to contact the patient and left message for patient to call the office.  

## 2022-11-24 NOTE — Telephone Encounter (Signed)
Patient left a voicemail stating she received a call from [facility] stating they cannot refill her Enbrel prescription until she speaks with someone from our office.  Patient requested a return call.

## 2022-11-24 NOTE — Telephone Encounter (Signed)
Patient returned call to the office. She states the pharmacy called her either Thursday or Friday and told her she needed to contact the office regarding her refill. Advised patient we sent her prescription in on 11/20/2022. Patient advised to call the pharmacy as maybe they call prior to [place] sending in the prescription. Patient advised to call the office if she has any trouble.

## 2022-12-24 NOTE — Progress Notes (Deleted)
Office Visit Note  Patient: Suzanne Erickson             Date of Birth: 1970/12/24           MRN: [id number]             PCP: Samuel Bouche, NP Referring: Samuel Bouche, NP Visit Date: 01/01/2023 Occupation: @GUAROCC @  Subjective:  No chief complaint on file.   History of Present Illness: Signe Javorsky is a 52 y.o. female ***     Activities of Daily Living:  Patient reports morning stiffness for *** {minute/hour:19697}.   Patient {ACTIONS;DENIES/REPORTS:21021675::"Denies"} nocturnal pain.  Difficulty dressing/grooming: {ACTIONS;DENIES/REPORTS:21021675::"Denies"} Difficulty climbing stairs: {ACTIONS;DENIES/REPORTS:21021675::"Denies"} Difficulty getting out of chair: {ACTIONS;DENIES/REPORTS:21021675::"Denies"} Difficulty using hands for taps, buttons, cutlery, and/or writing: {ACTIONS;DENIES/REPORTS:21021675::"Denies"}  No Rheumatology ROS completed.   PMFS History:  Patient Active Problem List   Diagnosis Date Noted   Primary osteoarthritis of both hands 06/10/2021   Primary osteoarthritis of both knees 06/10/2021   Primary osteoarthritis of both feet 06/10/2021   Long-term use of high-risk medication 08/04/2017   Rheumatoid arthritis involving multiple sites with positive rheumatoid factor ([place]) 05/07/2016   Low back pain 10/28/2012   Allergic rhinitis 10/23/2011   Anxiety 04/16/2011    Past Medical History:  Diagnosis Date   Anxiety    Rheumatoid arthritis ([place])     Family History  Problem Relation Age of Onset   Hypertension Mother    Osteoarthritis Mother    Hypertension Father    Skin cancer Father    Atrial fibrillation Father    Healthy Sister    Healthy Daughter    Healthy Daughter    Past Surgical History:  Procedure Laterality Date   ABDOMINOPLASTY     AUGMENTATION MAMMAPLASTY     Saline, behind the muscle, augmentation was over 15 years ago.   BACK SURGERY     Social History   Social History Narrative   Not on file   Immunization History   Administered Date(s) Administered   Influenza, Seasonal, Injecte, Preservative Fre 06/29/2013   Influenza,inj,Quad PF,6+ Mos 06/14/2020   PFIZER Comirnaty(Gray Top)Covid-19 Tri-Sucrose Vaccine 01/24/2021   PFIZER(Purple Top)SARS-COV-2 Vaccination 10/19/2019, 11/09/2019, 07/26/2020   Td 01/26/2004   Tdap 10/14/2007, 06/14/2020   Zoster Recombinat (Shingrix) 12/21/2020     Objective: Vital Signs: There were no vitals taken for this visit.   Physical Exam   Musculoskeletal Exam: ***  CDAI Exam: CDAI Score: -- Patient Global: --; Provider Global: -- Swollen: --; Tender: -- Joint Exam 01/01/2023   No joint exam has been documented for this visit   There is currently no information documented on the homunculus. Go to the Rheumatology activity and complete the homunculus joint exam.  Investigation: No additional findings.  Imaging: No results found.  Recent Labs: Lab Results  Component Value Date   WBC 5.0 07/16/2022   HGB 13.7 07/16/2022   PLT 201 07/16/2022   NA 139 07/16/2022   K 4.1 07/16/2022   CL 100 07/16/2022   CO2 30 07/16/2022   GLUCOSE 88 07/16/2022   BUN 9 07/16/2022   CREATININE 0.57 07/16/2022   BILITOT 0.4 07/16/2022   AST 36 (H) 07/16/2022   ALT 37 (H) 07/16/2022   PROT 7.4 07/16/2022   CALCIUM 9.4 07/16/2022   GFRAA 121 01/18/2021   QFTBGOLDPLUS NEGATIVE 01/24/2022    Speciality Comments: Dr. Franki Monte prescribed Plaquenil in 2015  Procedures:  No procedures performed Allergies: Amoxicillin and Sulfa antibiotics   Assessment / Plan:     Visit  Diagnoses: Rheumatoid arthritis involving multiple sites with positive rheumatoid factor (HCC)  High risk medication use  Chronic pain of left knee  Effusion, left knee  Primary osteoarthritis of both knees  Primary osteoarthritis of both hands  Chronic pain of both knees  Primary osteoarthritis of both feet  History of anxiety  Orders: No orders of the defined types were placed in this  encounter.  No orders of the defined types were placed in this encounter.   Face-to-face time spent with patient was *** minutes. Greater than 50% of time was spent in counseling and coordination of care.  Follow-Up Instructions: No follow-ups on file.   Ofilia Neas, PA-C  Note - This record has been created using Dragon software.  Chart creation errors have been sought, but may not always  have been located. Such creation errors do not reflect on  the standard of medical care.

## 2023-01-01 ENCOUNTER — Ambulatory Visit: Payer: BC Managed Care – PPO | Admitting: Rheumatology

## 2023-01-01 DIAGNOSIS — Z79899 Other long term (current) drug therapy: Secondary | ICD-10-CM

## 2023-01-01 DIAGNOSIS — M19041 Primary osteoarthritis, right hand: Secondary | ICD-10-CM

## 2023-01-01 DIAGNOSIS — M170 Bilateral primary osteoarthritis of knee: Secondary | ICD-10-CM

## 2023-01-01 DIAGNOSIS — M25462 Effusion, left knee: Secondary | ICD-10-CM

## 2023-01-01 DIAGNOSIS — Z8659 Personal history of other mental and behavioral disorders: Secondary | ICD-10-CM

## 2023-01-01 DIAGNOSIS — G8929 Other chronic pain: Secondary | ICD-10-CM

## 2023-01-01 DIAGNOSIS — M19072 Primary osteoarthritis, left ankle and foot: Secondary | ICD-10-CM

## 2023-01-01 DIAGNOSIS — M0579 Rheumatoid arthritis with rheumatoid factor of multiple sites without organ or systems involvement: Secondary | ICD-10-CM

## 2023-02-02 ENCOUNTER — Other Ambulatory Visit: Payer: Self-pay | Admitting: Physician Assistant

## 2023-02-02 DIAGNOSIS — M0579 Rheumatoid arthritis with rheumatoid factor of multiple sites without organ or systems involvement: Secondary | ICD-10-CM

## 2023-02-02 DIAGNOSIS — Z79899 Other long term (current) drug therapy: Secondary | ICD-10-CM

## 2023-07-14 ENCOUNTER — Other Ambulatory Visit (HOSPITAL_COMMUNITY): Payer: Self-pay

## 2023-09-16 IMAGING — CR DG CHEST 2V
2 series · 2 of 2 positions shown · non-contrast
Comparison: None.

CLINICAL DATA: Immunosuppressive therapy for rheumatoid arthritis.

EXAM:
CHEST - 2 VIEW

[chest pa]
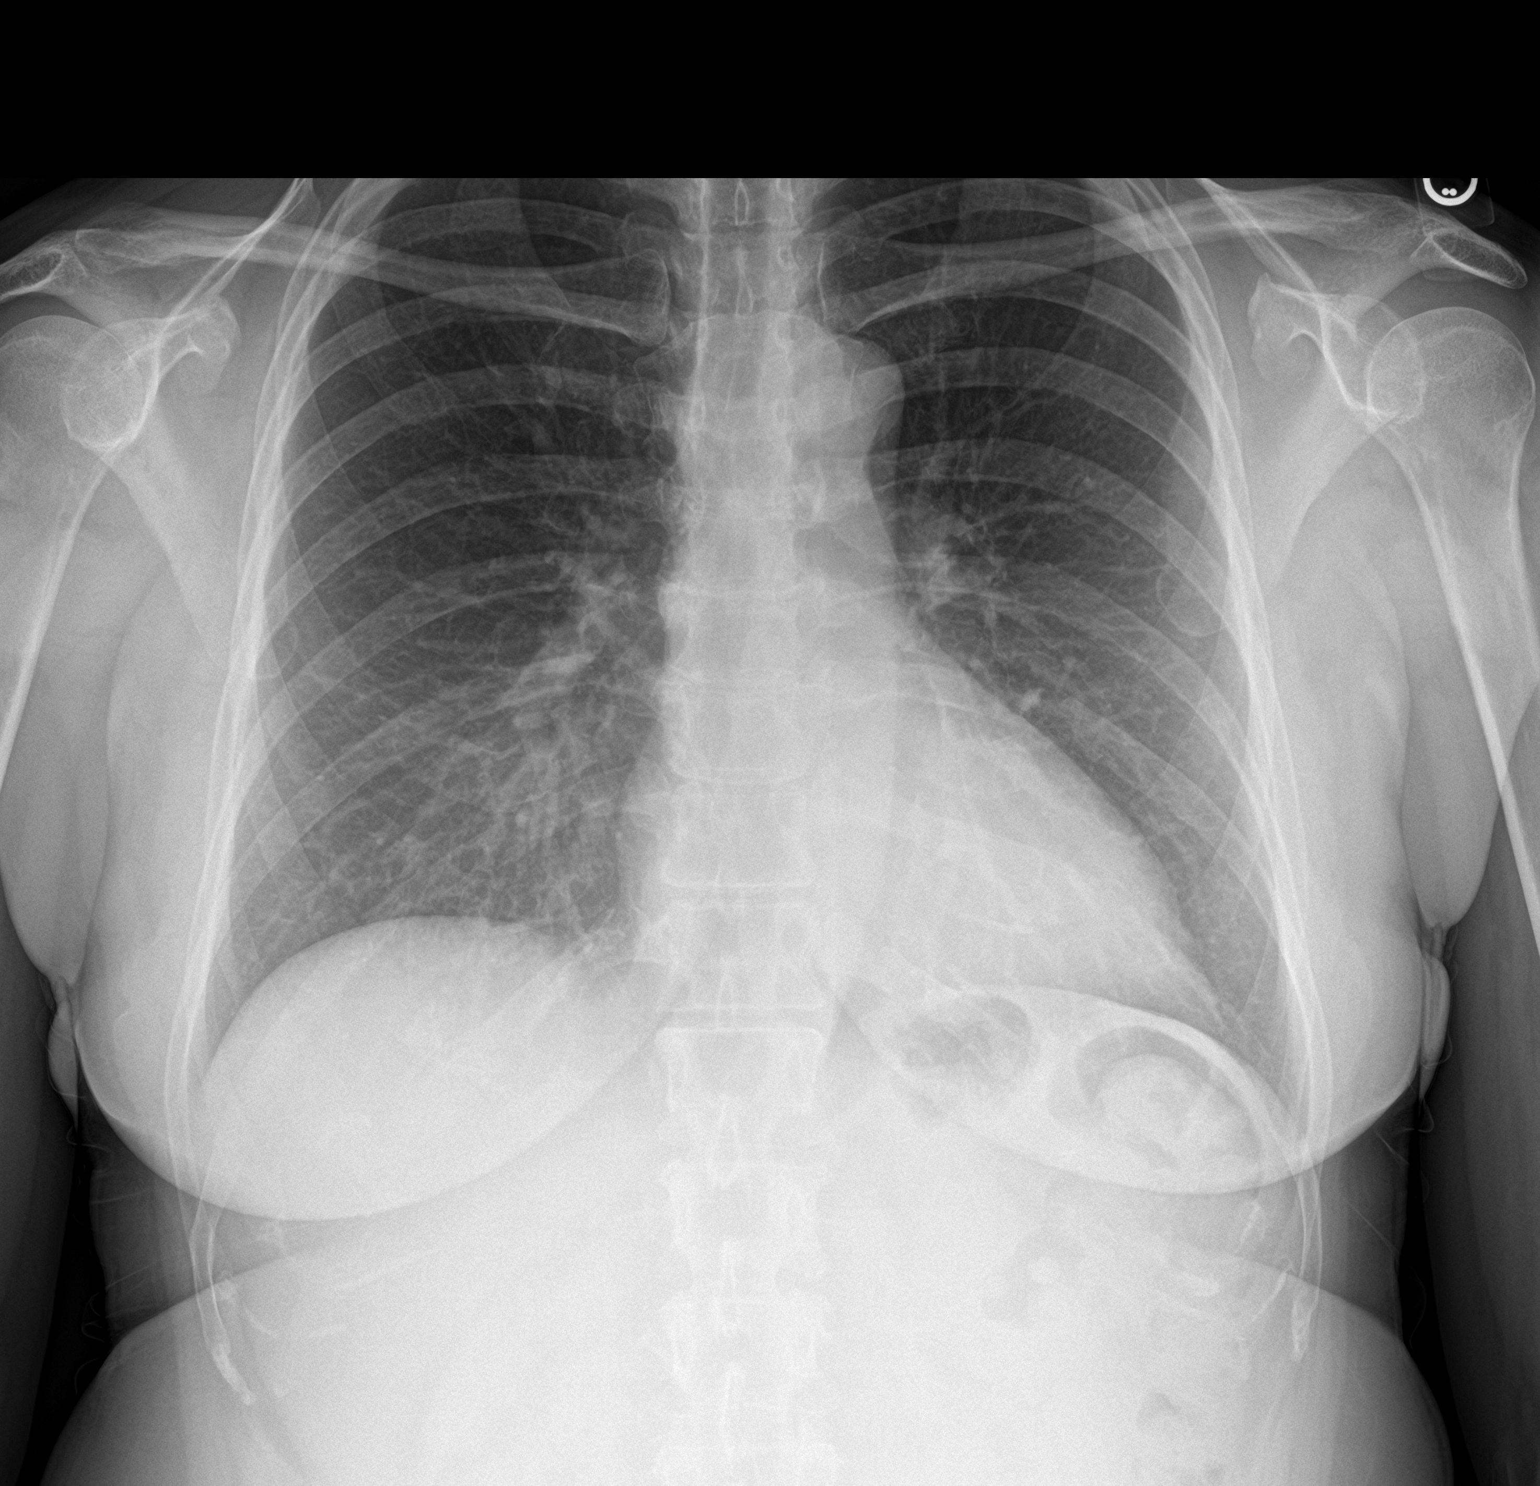

[chest lat]
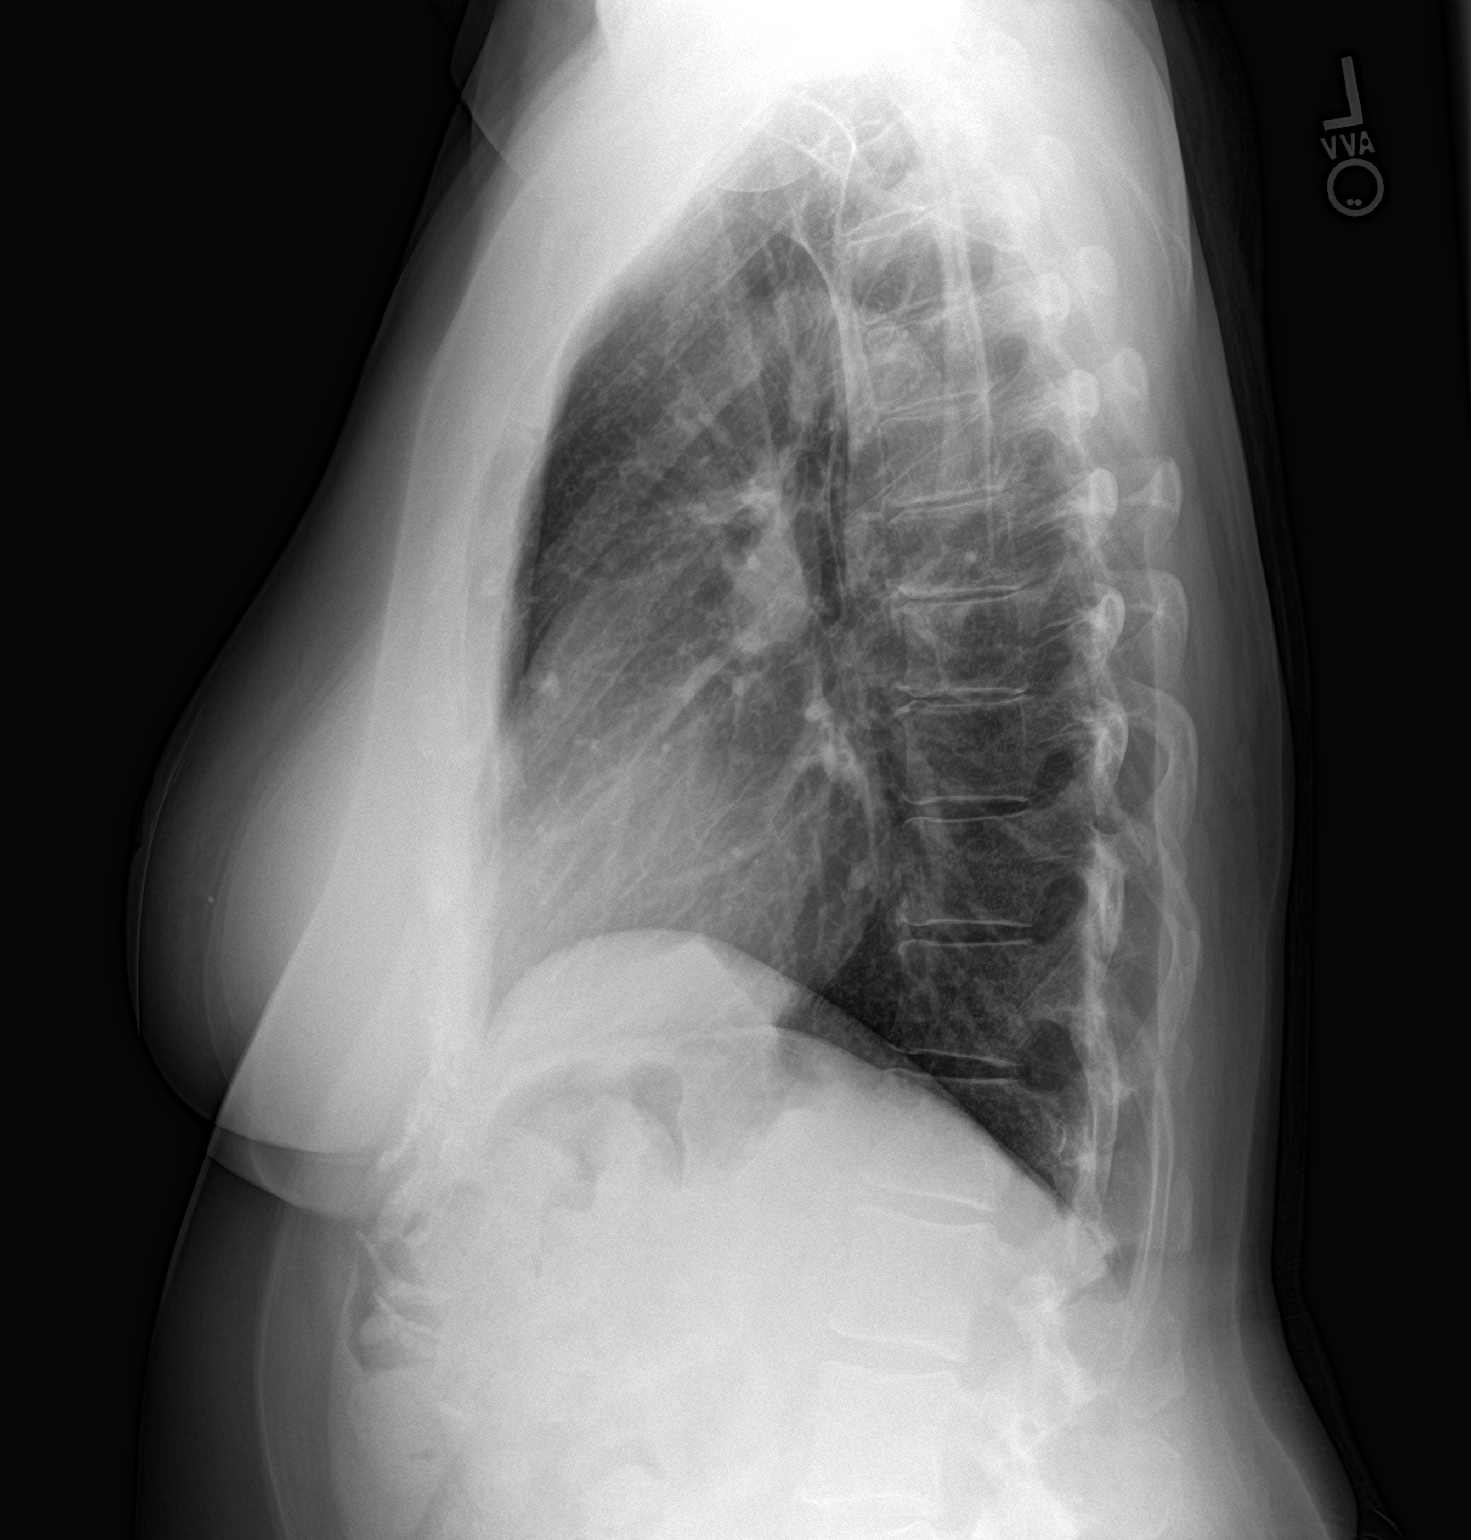

[2 of 2 positions shown; findings below may reference images not displayed]

FINDINGS: The cardiac silhouette, mediastinal and hilar contours are normal.
The lungs are clear. No pulmonary lesions or pleural effusions. No
interstitial changes or nodularity. The bony thorax is intact.
IMPRESSION: No acute cardiopulmonary findings.

## 2023-09-25 ENCOUNTER — Other Ambulatory Visit: Payer: Self-pay

## 2024-10-26 ENCOUNTER — Other Ambulatory Visit: Payer: Self-pay
